# Patient Record
Sex: Female | Born: 1965 | Race: White | Hispanic: No | Marital: Married | Smoking: Current every day smoker
Health system: Southern US, Community
[De-identification: ages and names within clinical notes are randomized; demographics above are authoritative.]

## PROBLEM LIST (undated history)

## (undated) DIAGNOSIS — L400 Psoriasis vulgaris: Secondary | ICD-10-CM

## (undated) DIAGNOSIS — R7989 Other specified abnormal findings of blood chemistry: Secondary | ICD-10-CM

## (undated) DIAGNOSIS — IMO0002 Reserved for concepts with insufficient information to code with codable children: Secondary | ICD-10-CM

## (undated) DIAGNOSIS — R87619 Unspecified abnormal cytological findings in specimens from cervix uteri: Secondary | ICD-10-CM

## (undated) DIAGNOSIS — K219 Gastro-esophageal reflux disease without esophagitis: Secondary | ICD-10-CM

## (undated) DIAGNOSIS — Z1371 Encounter for nonprocreative screening for genetic disease carrier status: Secondary | ICD-10-CM

## (undated) DIAGNOSIS — N946 Dysmenorrhea, unspecified: Secondary | ICD-10-CM

## (undated) DIAGNOSIS — R7309 Other abnormal glucose: Secondary | ICD-10-CM

## (undated) DIAGNOSIS — Z803 Family history of malignant neoplasm of breast: Secondary | ICD-10-CM

## (undated) DIAGNOSIS — E785 Hyperlipidemia, unspecified: Secondary | ICD-10-CM

## (undated) DIAGNOSIS — L309 Dermatitis, unspecified: Secondary | ICD-10-CM

## (undated) DIAGNOSIS — F319 Bipolar disorder, unspecified: Secondary | ICD-10-CM

## (undated) DIAGNOSIS — D219 Benign neoplasm of connective and other soft tissue, unspecified: Secondary | ICD-10-CM

## (undated) HISTORY — DX: Dermatitis, unspecified: L30.9

## (undated) HISTORY — DX: Other specified abnormal findings of blood chemistry: R79.89

## (undated) HISTORY — PX: VAGINAL WOUND CLOSURE / REPAIR: SUR258

## (undated) HISTORY — DX: Other abnormal glucose: R73.09

## (undated) HISTORY — DX: Benign neoplasm of connective and other soft tissue, unspecified: D21.9

## (undated) HISTORY — DX: Family history of malignant neoplasm of breast: Z80.3

## (undated) HISTORY — DX: Bipolar disorder, unspecified: F31.9

## (undated) HISTORY — DX: Psoriasis vulgaris: L40.0

## (undated) HISTORY — PX: CHOLECYSTECTOMY: SHX55

## (undated) HISTORY — DX: Encounter for nonprocreative screening for genetic disease carrier status: Z13.71

## (undated) HISTORY — DX: Reserved for concepts with insufficient information to code with codable children: IMO0002

## (undated) HISTORY — DX: Dysmenorrhea, unspecified: N94.6

## (undated) HISTORY — DX: Unspecified abnormal cytological findings in specimens from cervix uteri: R87.619

## (undated) HISTORY — DX: Hyperlipidemia, unspecified: E78.5

---

## 1989-05-18 HISTORY — PX: TUBAL LIGATION: SHX77

## 1990-05-18 HISTORY — PX: BREAST SURGERY: SHX581

## 2007-02-21 ENCOUNTER — Other Ambulatory Visit: Admission: RE | Admit: 2007-02-21 | Discharge: 2007-02-21 | Payer: Self-pay | Admitting: Obstetrics and Gynecology

## 2007-02-24 ENCOUNTER — Ambulatory Visit (HOSPITAL_COMMUNITY): Admission: RE | Admit: 2007-02-24 | Discharge: 2007-02-24 | Payer: Self-pay | Admitting: Certified Nurse Midwife

## 2007-04-05 HISTORY — PX: NOVASURE ABLATION: SHX5394

## 2007-12-06 ENCOUNTER — Emergency Department: Payer: Self-pay | Admitting: Emergency Medicine

## 2007-12-08 ENCOUNTER — Emergency Department (HOSPITAL_COMMUNITY): Admission: EM | Admit: 2007-12-08 | Discharge: 2007-12-08 | Payer: Self-pay | Admitting: Emergency Medicine

## 2008-03-22 ENCOUNTER — Other Ambulatory Visit: Admission: RE | Admit: 2008-03-22 | Discharge: 2008-03-22 | Payer: Self-pay | Admitting: Obstetrics & Gynecology

## 2008-04-13 ENCOUNTER — Encounter: Admission: RE | Admit: 2008-04-13 | Discharge: 2008-04-13 | Payer: Self-pay | Admitting: Obstetrics and Gynecology

## 2008-09-15 HISTORY — PX: ROBOTIC ASSISTED TOTAL HYSTERECTOMY: SHX6085

## 2008-09-17 ENCOUNTER — Encounter: Payer: Self-pay | Admitting: Obstetrics and Gynecology

## 2008-09-17 ENCOUNTER — Ambulatory Visit (HOSPITAL_COMMUNITY): Admission: RE | Admit: 2008-09-17 | Discharge: 2008-09-18 | Payer: Self-pay | Admitting: Obstetrics and Gynecology

## 2009-03-18 HISTORY — PX: BREAST BIOPSY: SHX20

## 2009-04-02 ENCOUNTER — Encounter: Admission: RE | Admit: 2009-04-02 | Discharge: 2009-04-02 | Payer: Self-pay | Admitting: Obstetrics and Gynecology

## 2009-04-02 ENCOUNTER — Encounter (INDEPENDENT_AMBULATORY_CARE_PROVIDER_SITE_OTHER): Payer: Self-pay | Admitting: Diagnostic Radiology

## 2009-04-18 ENCOUNTER — Encounter: Admission: RE | Admit: 2009-04-18 | Discharge: 2009-04-18 | Payer: Self-pay | Admitting: General Surgery

## 2009-04-18 ENCOUNTER — Other Ambulatory Visit: Admission: RE | Admit: 2009-04-18 | Discharge: 2009-04-18 | Payer: Self-pay | Admitting: General Surgery

## 2009-05-18 DIAGNOSIS — Z1371 Encounter for nonprocreative screening for genetic disease carrier status: Secondary | ICD-10-CM

## 2009-05-18 HISTORY — DX: Encounter for nonprocreative screening for genetic disease carrier status: Z13.71

## 2009-07-16 HISTORY — PX: LAPAROSCOPIC CHOLECYSTECTOMY SINGLE PORT: SHX5891

## 2009-10-03 ENCOUNTER — Ambulatory Visit: Payer: Self-pay | Admitting: Genetic Counselor

## 2010-03-09 ENCOUNTER — Ambulatory Visit: Payer: Self-pay | Admitting: Diagnostic Radiology

## 2010-03-09 ENCOUNTER — Emergency Department (HOSPITAL_BASED_OUTPATIENT_CLINIC_OR_DEPARTMENT_OTHER): Admission: EM | Admit: 2010-03-09 | Discharge: 2010-03-09 | Payer: Self-pay | Admitting: Emergency Medicine

## 2010-04-03 ENCOUNTER — Encounter: Admission: RE | Admit: 2010-04-03 | Discharge: 2010-04-03 | Payer: Self-pay | Admitting: Obstetrics and Gynecology

## 2010-05-21 ENCOUNTER — Emergency Department (HOSPITAL_BASED_OUTPATIENT_CLINIC_OR_DEPARTMENT_OTHER)
Admission: EM | Admit: 2010-05-21 | Discharge: 2010-05-21 | Payer: Self-pay | Source: Home / Self Care | Admitting: Emergency Medicine

## 2010-05-21 LAB — URINALYSIS, ROUTINE W REFLEX MICROSCOPIC
Bilirubin Urine: NEGATIVE
Hemoglobin, Urine: NEGATIVE
Ketones, ur: NEGATIVE mg/dL
Nitrite: NEGATIVE
Protein, ur: NEGATIVE mg/dL
Specific Gravity, Urine: 1.021 (ref 1.005–1.030)
Urine Glucose, Fasting: NEGATIVE mg/dL
Urobilinogen, UA: 0.2 mg/dL (ref 0.0–1.0)
pH: 7 (ref 5.0–8.0)

## 2010-05-21 LAB — D-DIMER, QUANTITATIVE: D-Dimer, Quant: <0.22 ug/mL-FEU (ref 0.00–0.48)

## 2010-05-21 LAB — DIFFERENTIAL
Basophils Absolute: 0 10*3/uL (ref 0.0–0.1)
Basophils Relative: 0 % (ref 0–1)
Eosinophils Absolute: 0.1 10*3/uL (ref 0.0–0.7)
Eosinophils Relative: 1 % (ref 0–5)
Lymphocytes Relative: 36 % (ref 12–46)
Lymphs Abs: 3.4 10*3/uL (ref 0.7–4.0)
Monocytes Absolute: 0.8 10*3/uL (ref 0.1–1.0)
Monocytes Relative: 8 % (ref 3–12)
Neutro Abs: 5.2 10*3/uL (ref 1.7–7.7)
Neutrophils Relative %: 55 % (ref 43–77)

## 2010-05-21 LAB — BASIC METABOLIC PANEL
BUN: 12 mg/dL (ref 6–23)
CO2: 22 mEq/L (ref 19–32)
Calcium: 9.4 mg/dL (ref 8.4–10.5)
Chloride: 108 mEq/L (ref 96–112)
Creatinine, Ser: 0.7 mg/dL (ref 0.4–1.2)
GFR calc Af Amer: >60 mL/min (ref >60)
GFR calc non Af Amer: >60 mL/min (ref >60)
Glucose, Bld: 91 mg/dL (ref 70–99)
Potassium: 4.1 mEq/L (ref 3.5–5.1)
Sodium: 142 mEq/L (ref 135–145)

## 2010-05-21 LAB — CBC
HCT: 39.9 % (ref 36.0–46.0)
Hemoglobin: 14.1 g/dL (ref 12.0–15.0)
MCH: 30.7 pg (ref 26.0–34.0)
MCHC: 35.3 g/dL (ref 30.0–36.0)
MCV: 86.7 fL (ref 78.0–100.0)
Platelets: 245 10*3/uL (ref 150–400)
RBC: 4.6 MIL/uL (ref 3.87–5.11)
RDW: 13.3 % (ref 11.5–15.5)
WBC: 9.4 10*3/uL (ref 4.0–10.5)

## 2010-05-21 LAB — POCT CARDIAC MARKERS
CKMB, poc: <1 ng/mL — ABNORMAL LOW (ref 1.0–8.0)
CKMB, poc: <1 ng/mL — ABNORMAL LOW (ref 1.0–8.0)
Myoglobin, poc: 52.1 ng/mL (ref 12–200)
Myoglobin, poc: 50.2 ng/mL (ref 12–200)
Troponin i, poc: <0.05 ng/mL (ref 0.00–0.09)
Troponin i, poc: <0.05 ng/mL (ref 0.00–0.09)

## 2010-05-21 LAB — PREGNANCY, URINE: Preg Test, Ur: NEGATIVE

## 2010-08-26 LAB — CBC
HCT: 33.3 % — ABNORMAL LOW (ref 36.0–46.0)
Hemoglobin: 11.6 g/dL — ABNORMAL LOW (ref 12.0–15.0)
Platelets: 253 10*3/uL (ref 150–400)
RBC: 3.54 MIL/uL — ABNORMAL LOW (ref 3.87–5.11)
WBC: 12 10*3/uL — ABNORMAL HIGH (ref 4.0–10.5)
WBC: 17.3 10*3/uL — ABNORMAL HIGH (ref 4.0–10.5)

## 2010-09-30 NOTE — Op Note (Signed)
[name], Alicia Long                 ACCOUNT NO.:  [id number]   MEDICAL RECORD NO.:  [id number]          PATIENT TYPE:  OIB   LOCATION:  9318                          FACILITY:  [facility]   PHYSICIAN:  Cynthia P. Romine, M.D.DATE OF BIRTH:  [birth year]   DATE OF PROCEDURE:  DATE OF DISCHARGE:                               OPERATIVE REPORT   PREOPERATIVE DIAGNOSES:  Menorrhagia, dysmenorrhea.   POSTOPERATIVE DIAGNOSES:  Menorrhagia, dysmenorrhea, path pending.   PROCEDURE:  Total laparoscopic hysterectomy.   SURGEON:  Cynthia P. Romine, MD   ANESTHESIA:  General endotracheal.   ESTIMATED BLOOD LOSS:  300 mL.   COMPLICATIONS:  None.   PROCEDURE:  The patient was taken to the operating room, and after the  induction of adequate general endotracheal anesthesia, was prepped and  draped in the usual fashion and a Foley catheter inserted.  A posterior  weighted and anterior Sims retractor were placed in the vagina.  The  cervix was grasped on its anterior and posterior lips with a single-  tooth tenaculum.  The uterus sounded to 9 cm.  The cervix was dilated to  a #25 Shawnie Pons.  An 8-cm Rumi manipulator was inserted with a medium coring  and 50 mL of water was placed in the occluder.  Attention was next  turned to the abdomen.  A small infraumbilical incision was made at the  site of her previous tubal sterilization procedure after infiltrating it  with 0.25% plain Marcaine.  The Veress needle was inserted into the  peritoneal space by hooking up the CO2 insufflator to the needle and  watching the pressure drop as the peritoneal space was entered.  Pneumoperitoneum was created with 3 L of CO2 with the automatic  insufflator.  The OptiVu trocar was then inserted into the peritoneal  space.  The pelvis was inspected.  The uterus was enlarged about 10-week  size and mobile.  The ovaries and tubes appeared normal except that the  patient was status post a tubal sterilization procedure.  The mid  isthmic portions of the tubes on both sides were absent.  The anterior  and posterior cul-de-sacs were free.  A 10-mm trocar was inserted under  direct visualization after infiltration with Marcaine into the skin in  the right mid quadrant and a 10 mm one was inserted into the left mid  quadrant and the 5 mm one was inserted into the right lower quadrant.  An atraumatic grasper was placed at the 5-mm port.  The EnSeal device  was placed through the 10-mm port, and the pelvis was again carefully  inspected.  The ureters were found peristalsing on both sides and their  courses were clearly visible.  The EnSeal was used to divide the round  ligament and then the utero-ovarian ligaments and mesosalpinx on each  side.  The broad ligament was taken down sharply with laparoscopic  scissors, anterior and posterior leaf.  The bladder was dissected  sharply and bluntly off the cervix.  The uterine arteries were  skeletonized.  They were clearly visible.  The coring was also clearly  palpable.  The coring was elevated as much as possible, and the uterine  arteries were taken with the EnSeal device.  The harmonic ACE was then  used for the colpotomy incision, and the specimen was delivered through  the vagina.  The vaginal mucosa was closed vaginally with 4 interrupted  sutures of 0 Vicryl.  Attention was next turned back to the abdomen.  The pedicles were all inspected and were hemostatic.  The left ureter  could not be reidentified presumably due to swelling and gas behind the  peritoneum, but the right one was clearly identified and the pedicles  were not near enough to the course of the ureter where it had been seen  before for the ureter to have been injured in any way.  These pedicles  were quite far away from the ureter from the course where it had been  seen before.  The pelvis was irrigated.  The pneumoperitoneum was  allowed to escape about halfway, and then the pedicles were inspected   again and were hemostatic.  Trocars were then removed under direct  visualization.  The pneumoperitoneum was further allowed to escape.  The  fascia was closed at the umbilicus in the 10-mm port.  The skin was  closed subcuticularly in the 10-mm port with 4-0 Vicryl Rapide, and then  Benzoin and Steri-Strips were placed over all the incisions.  Instruments were removed from the vagina.  The Foley catheter was  removed, and the procedure was terminated.  The patient tolerated it  well and went in satisfactory condition to postanesthesia recovery.      Cynthia P. Romine, M.D.  Electronically Signed     CPR/MEDQ  D:  09/17/2008  T:  09/18/2008  Job:  [id number]

## 2010-10-03 NOTE — Discharge Summary (Signed)
NAMELAIKLYNN, RACZYNSKI                 ACCOUNT NO.:  [id number]   MEDICAL RECORD NO.:  [id number]          PATIENT TYPE:  OIB   LOCATION:  9318                          FACILITY:  [facility]   PHYSICIAN:  Cynthia P. Romine, M.D.DATE OF BIRTH:  10/30/1965   DATE OF ADMISSION:  09/17/2008  DATE OF DISCHARGE:  09/18/2008                               DISCHARGE SUMMARY   DISCHARGE DIAGNOSIS:  Menorrhagia, dysmenorrhea, and leiomyomata.   PROCEDURE:  Total laparoscopic hysterectomy.   HISTORY:  This is a 45 year old white female with complaints of  dysmenorrhea and menorrhagia, who is admitted to undergo a total  laparoscopic hysterectomy.  On Sep 17, 2008, she underwent a total  laparoscopic hysterectomy.  Estimated blood loss was 300 mL.  There were  no complications.  Postoperatively, she did very well, had no  postoperative complications and was discharged on postoperative day #1,  afebrile and in good condition.   LABS FINDINGS:  On admission, her H&H was 14 and 41.  On discharge, 11.6  and 33.3.   Pathology confirmed leiomyomata with a uterine weight of 111 g.      Cynthia P. Romine, M.D.  Electronically Signed     CPR/MEDQ  D:  10/12/2008  T:  10/13/2008  Job:  [id number]

## 2011-02-13 LAB — URINE MICROSCOPIC-ADD ON

## 2011-02-13 LAB — CBC
HCT: 42.1
Hemoglobin: 14.4
MCHC: 34.3
RBC: 4.63
RDW: 12.9

## 2011-02-13 LAB — URINE CULTURE

## 2011-02-13 LAB — LIPASE, BLOOD: Lipase: 22

## 2011-02-13 LAB — COMPREHENSIVE METABOLIC PANEL
Alkaline Phosphatase: 67
BUN: 9
CO2: 25
GFR calc non Af Amer: >60
Glucose, Bld: 94
Potassium: 3.6
Total Protein: 7.2

## 2011-02-13 LAB — DIFFERENTIAL
Basophils Absolute: 0
Basophils Relative: 0
Eosinophils Absolute: 0.1
Monocytes Relative: 6
Neutro Abs: 7.1
Neutrophils Relative %: 60

## 2011-02-13 LAB — URINALYSIS, ROUTINE W REFLEX MICROSCOPIC
Glucose, UA: NEGATIVE
Hgb urine dipstick: NEGATIVE
Specific Gravity, Urine: 1.019

## 2011-03-05 ENCOUNTER — Other Ambulatory Visit: Payer: Self-pay | Admitting: Obstetrics and Gynecology

## 2011-03-05 DIAGNOSIS — Z1231 Encounter for screening mammogram for malignant neoplasm of breast: Secondary | ICD-10-CM

## 2011-04-06 ENCOUNTER — Ambulatory Visit
Admission: RE | Admit: 2011-04-06 | Discharge: 2011-04-06 | Disposition: A | Discharge status: Home / Self Care | Payer: Managed Care, Other (non HMO) | Source: Ambulatory Visit | Attending: Obstetrics and Gynecology | Admitting: Obstetrics and Gynecology

## 2011-04-06 DIAGNOSIS — Z1231 Encounter for screening mammogram for malignant neoplasm of breast: Secondary | ICD-10-CM

## 2012-03-02 ENCOUNTER — Other Ambulatory Visit: Payer: Self-pay | Admitting: Obstetrics and Gynecology

## 2012-03-02 DIAGNOSIS — Z1231 Encounter for screening mammogram for malignant neoplasm of breast: Secondary | ICD-10-CM

## 2012-04-06 ENCOUNTER — Ambulatory Visit
Admission: RE | Admit: 2012-04-06 | Discharge: 2012-04-06 | Disposition: A | Discharge status: Home / Self Care | Payer: Managed Care, Other (non HMO) | Source: Ambulatory Visit | Attending: Obstetrics and Gynecology | Admitting: Obstetrics and Gynecology

## 2012-04-06 DIAGNOSIS — Z1231 Encounter for screening mammogram for malignant neoplasm of breast: Secondary | ICD-10-CM

## 2012-06-18 HISTORY — PX: NASAL SINUS SURGERY: SHX719

## 2012-06-18 HISTORY — PX: TYMPANOSTOMY TUBE PLACEMENT: SHX32

## 2012-07-01 ENCOUNTER — Ambulatory Visit: Payer: Self-pay | Admitting: Unknown Physician Specialty

## 2012-08-10 ENCOUNTER — Ambulatory Visit: Payer: Self-pay | Admitting: Unknown Physician Specialty

## 2012-11-14 ENCOUNTER — Emergency Department: Payer: Self-pay | Admitting: Internal Medicine

## 2012-11-24 ENCOUNTER — Encounter: Payer: Self-pay | Admitting: Obstetrics and Gynecology

## 2012-11-25 ENCOUNTER — Ambulatory Visit (INDEPENDENT_AMBULATORY_CARE_PROVIDER_SITE_OTHER): Payer: Managed Care, Other (non HMO) | Admitting: Obstetrics and Gynecology

## 2012-11-25 ENCOUNTER — Encounter: Payer: Self-pay | Admitting: Obstetrics and Gynecology

## 2012-11-25 VITALS — BP 100/60 | Ht 66.0 in | Wt 194.0 lb

## 2012-11-25 DIAGNOSIS — Z01419 Encounter for gynecological examination (general) (routine) without abnormal findings: Secondary | ICD-10-CM

## 2012-11-25 DIAGNOSIS — Z0000 Encounter for general adult medical examination without abnormal findings: Secondary | ICD-10-CM

## 2012-11-25 LAB — HEMOGLOBIN, FINGERSTICK: Hemoglobin, fingerstick: 13.7 g/dL (ref 12.0–16.0)

## 2012-11-25 NOTE — Patient Instructions (Addendum)
EXERCISE AND DIET:  We recommended that you start or continue a regular exercise program for good health. Regular exercise means any activity that makes your heart beat faster and makes you sweat.  We recommend exercising at least 30 minutes per day at least 3 days a week, preferably 4 or 5.  We also recommend a diet low in fat and sugar.  Inactivity, poor dietary choices and obesity can cause diabetes, heart attack, stroke, and kidney damage, among others.    ALCOHOL AND SMOKING:  Women should limit their alcohol intake to no more than 7 drinks/beers/glasses of wine (combined, not each!) per week. Moderation of alcohol intake to this level decreases your risk of breast cancer and liver damage. And of course, no recreational drugs are part of a healthy lifestyle.  And absolutely no smoking or even second hand smoke. Most people know smoking can cause heart and lung diseases, but did you know it also contributes to weakening of your bones? Aging of your skin?  Yellowing of your teeth and nails?  CALCIUM AND VITAMIN D:  Adequate intake of calcium and Vitamin D are recommended.  The recommendations for exact amounts of these supplements seem to change often, but generally speaking 600 mg of calcium (either carbonate or citrate) and 800 units of Vitamin D per day seems prudent. Certain women may benefit from higher intake of Vitamin D.  If you are among these women, your doctor will have told you during your visit.    PAP SMEARS:  Pap smears, to check for cervical cancer or precancers,  have traditionally been done yearly, although recent scientific advances have shown that most women can have pap smears less often.  However, every woman still should have a physical exam from her gynecologist every year. It will include a breast check, inspection of the vulva and vagina to check for abnormal growths or skin changes, a visual exam of the cervix, and then an exam to evaluate the size and shape of the uterus and  ovaries.  And after 45 years of age, a rectal exam is indicated to check for rectal cancers. We will also provide age appropriate advice regarding health maintenance, like when you should have certain vaccines, screening for sexually transmitted diseases, bone density testing, colonoscopy, mammograms, etc.   MAMMOGRAMS:  All women over 40 years old should have a yearly mammogram. Many facilities now offer a "3D" mammogram, which may cost around $50 extra out of pocket. If possible,  we recommend you accept the option to have the 3D mammogram performed.  It both reduces the number of women who will be called back for extra views which then turn out to be normal, and it is better than the routine mammogram at detecting truly abnormal areas.       
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
## 2012-11-26 LAB — VITAMIN D 25 HYDROXY (VIT D DEFICIENCY, FRACTURES): Vit D, 25-Hydroxy: 44 ng/mL (ref 30–89)

## 2013-03-07 ENCOUNTER — Other Ambulatory Visit: Payer: Self-pay

## 2013-03-07 DIAGNOSIS — Z1231 Encounter for screening mammogram for malignant neoplasm of breast: Secondary | ICD-10-CM

## 2013-03-17 ENCOUNTER — Other Ambulatory Visit: Payer: Self-pay | Admitting: Emergency Medicine

## 2013-03-17 ENCOUNTER — Ambulatory Visit
Admission: RE | Admit: 2013-03-17 | Discharge: 2013-03-17 | Disposition: A | Discharge status: Home / Self Care | Payer: Managed Care, Other (non HMO) | Source: Ambulatory Visit | Attending: Emergency Medicine | Admitting: Emergency Medicine

## 2013-03-17 DIAGNOSIS — M545 Low back pain, unspecified: Secondary | ICD-10-CM

## 2013-03-17 DIAGNOSIS — M542 Cervicalgia: Secondary | ICD-10-CM

## 2013-03-23 ENCOUNTER — Other Ambulatory Visit: Payer: Self-pay

## 2013-04-10 ENCOUNTER — Ambulatory Visit: Payer: Managed Care, Other (non HMO)

## 2013-05-22 ENCOUNTER — Ambulatory Visit
Admission: RE | Admit: 2013-05-22 | Discharge: 2013-05-22 | Disposition: A | Discharge status: Home / Self Care | Payer: Managed Care, Other (non HMO) | Source: Ambulatory Visit

## 2013-05-22 DIAGNOSIS — Z1231 Encounter for screening mammogram for malignant neoplasm of breast: Secondary | ICD-10-CM

## 2013-11-28 ENCOUNTER — Ambulatory Visit: Payer: Managed Care, Other (non HMO) | Admitting: Gynecology

## 2013-12-13 ENCOUNTER — Encounter: Payer: Self-pay | Admitting: Gynecology

## 2013-12-13 ENCOUNTER — Telehealth: Payer: Self-pay | Admitting: Gynecology

## 2013-12-13 ENCOUNTER — Ambulatory Visit (INDEPENDENT_AMBULATORY_CARE_PROVIDER_SITE_OTHER): Payer: Managed Care, Other (non HMO) | Admitting: Gynecology

## 2013-12-13 VITALS — BP 114/66 | HR 74 | Resp 18 | Ht 67.75 in | Wt 176.0 lb

## 2013-12-13 DIAGNOSIS — Z79899 Other long term (current) drug therapy: Secondary | ICD-10-CM

## 2013-12-13 DIAGNOSIS — IMO0002 Reserved for concepts with insufficient information to code with codable children: Secondary | ICD-10-CM

## 2013-12-13 DIAGNOSIS — F3175 Bipolar disorder, in partial remission, most recent episode depressed: Secondary | ICD-10-CM

## 2013-12-13 DIAGNOSIS — Z01419 Encounter for gynecological examination (general) (routine) without abnormal findings: Secondary | ICD-10-CM

## 2013-12-13 DIAGNOSIS — N644 Mastodynia: Secondary | ICD-10-CM

## 2013-12-13 DIAGNOSIS — R634 Abnormal weight loss: Secondary | ICD-10-CM

## 2013-12-13 DIAGNOSIS — K649 Unspecified hemorrhoids: Secondary | ICD-10-CM

## 2013-12-13 DIAGNOSIS — Z1371 Encounter for nonprocreative screening for genetic disease carrier status: Secondary | ICD-10-CM

## 2013-12-13 DIAGNOSIS — Z0000 Encounter for general adult medical examination without abnormal findings: Secondary | ICD-10-CM

## 2013-12-13 DIAGNOSIS — Z8049 Family history of malignant neoplasm of other genital organs: Secondary | ICD-10-CM

## 2013-12-13 LAB — CBC WITH DIFFERENTIAL/PLATELET
Basophils Absolute: 0 10*3/uL (ref 0.0–0.1)
Basophils Relative: 0 % (ref 0–1)
Eosinophils Absolute: 0.2 10*3/uL (ref 0.0–0.7)
Eosinophils Relative: 2 % (ref 0–5)
HCT: 40.7 % (ref 36.0–46.0)
HEMOGLOBIN: 13.9 g/dL (ref 12.0–15.0)
LYMPHS PCT: 46 % (ref 12–46)
Lymphs Abs: 4.3 10*3/uL — ABNORMAL HIGH (ref 0.7–4.0)
MCH: 31.8 pg (ref 26.0–34.0)
MCHC: 34.2 g/dL (ref 30.0–36.0)
MCV: 93.1 fL (ref 78.0–100.0)
Monocytes Absolute: 0.5 10*3/uL (ref 0.1–1.0)
Monocytes Relative: 5 % (ref 3–12)
NEUTROS ABS: 4.4 10*3/uL (ref 1.7–7.7)
Neutrophils Relative %: 47 % (ref 43–77)
Platelets: 222 10*3/uL (ref 150–400)
RBC: 4.37 MIL/uL (ref 3.87–5.11)
RDW: 13.3 % (ref 11.5–15.5)
WBC: 9.3 10*3/uL (ref 4.0–10.5)

## 2013-12-13 LAB — HEPATIC FUNCTION PANEL
ALBUMIN: 4.5 g/dL (ref 3.5–5.2)
ALK PHOS: 55 U/L (ref 39–117)
ALT: 8 U/L (ref 0–35)
AST: 13 U/L (ref 0–37)
BILIRUBIN TOTAL: 0.3 mg/dL (ref 0.2–1.2)
Bilirubin, Direct: 0.1 mg/dL (ref 0.0–0.3)
Indirect Bilirubin: 0.2 mg/dL (ref 0.2–1.2)
Total Protein: 6.8 g/dL (ref 6.0–8.3)

## 2013-12-13 LAB — POCT URINALYSIS DIPSTICK
Leukocytes, UA: NEGATIVE
PH UA: 6
Urobilinogen, UA: NEGATIVE

## 2013-12-13 LAB — HEMOGLOBIN, FINGERSTICK: Hemoglobin, fingerstick: 14.3 g/dL (ref 12.0–16.0)

## 2013-12-13 LAB — HEMOGLOBIN A1C
Hgb A1c MFr Bld: 5.6 % (ref <5.7)
Mean Plasma Glucose: 114 mg/dL (ref <117)

## 2013-12-13 LAB — TSH: TSH: 1.838 u[IU]/mL (ref 0.350–4.500)

## 2013-12-13 LAB — GLUCOSE, RANDOM: GLUCOSE: 86 mg/dL (ref 70–99)

## 2013-12-13 MED ORDER — HYDROCORTISONE ACETATE 25 MG RE SUPP: 25.0000 mg | Freq: Two times a day (BID) | RECTAL | Status: DC

## 2013-12-13 NOTE — Telephone Encounter (Signed)
Pt will be leaving to go out of town on next Friday and would like to have u/s done before if possible. Also was confused as to what Dr Charlies Constable was saying about a general surgeon when she was here today.

## 2013-12-13 NOTE — Telephone Encounter (Signed)
RETURNING A CALL TO KAITLYN
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
## 2013-12-13 NOTE — Telephone Encounter (Signed)
Left message to call [place] at [phone].    Offer patient u/s appointment for next Tuesday with Dr.Lathrop. Will need to be precerted still as patient was just seen today. Dr.Lathrop made referral to General Surgery for patient to be seen for further options for mastalgia.

## 2013-12-13 NOTE — Addendum Note (Signed)
Addended by: Elveria Rising on: 12/13/2013 01:34 PM   Modules accepted: Orders

## 2013-12-14 LAB — VALPROIC ACID LEVEL: VALPROIC ACID LVL: 63.7 ug/mL (ref 50.0–100.0)

## 2013-12-14 NOTE — Telephone Encounter (Signed)
Spoke with patient. Advised that per benefit quote received, she will be responsible for $308.71 when she comes in for PUS. Patient states that she will speak with her husband and call back to let us know if she wants to keep the appointment.

## 2013-12-14 NOTE — Telephone Encounter (Signed)
Spoke with patient. Appointment scheduled for ultrasound on 8/4 at 1:30pm and 2pm consult with Dr.Lathrop. Patient agreeable to date and time. Patient aware that billing has not had a chance to precert u/s. Advised this will be done before appointment and that she will be contacted by billing if she will owe anything more than a standard office copay for visit. Patient is agreeable and verbalizes understanding.  Cc: Felipa Emory  Routing to provider for final review. Patient agreeable to disposition. Will close encounter

## 2013-12-19 ENCOUNTER — Ambulatory Visit (INDEPENDENT_AMBULATORY_CARE_PROVIDER_SITE_OTHER): Payer: Managed Care, Other (non HMO) | Admitting: Gynecology

## 2013-12-19 ENCOUNTER — Ambulatory Visit (INDEPENDENT_AMBULATORY_CARE_PROVIDER_SITE_OTHER): Payer: Managed Care, Other (non HMO)

## 2013-12-19 VITALS — BP 110/82 | Resp 12 | Ht 67.75 in | Wt 177.0 lb

## 2013-12-19 DIAGNOSIS — Z808 Family history of malignant neoplasm of other organs or systems: Secondary | ICD-10-CM

## 2013-12-19 DIAGNOSIS — R634 Abnormal weight loss: Secondary | ICD-10-CM

## 2013-12-19 DIAGNOSIS — Z8049 Family history of malignant neoplasm of other genital organs: Secondary | ICD-10-CM

## 2013-12-19 DIAGNOSIS — Z803 Family history of malignant neoplasm of breast: Secondary | ICD-10-CM

## 2013-12-19 NOTE — Progress Notes (Signed)
      Pt here for PUS due to unexplained weight loss and family history of female breast cancer.   TSH, glucose, Hb A1c, normal Images reviewed with pt- Bilateral ovarian cysts with debris, right 2.5x2.1cm No free fluid, no abnormal appearance. Pre-menopausal. Pt relieved,  Will keep up with diet and weight changes, if persists will f/u with PCP. Questions addressed. 72m sepnt counseling, >50% face to face

## 2013-12-21 NOTE — Telephone Encounter (Signed)
Thayer Headings is calling to let us know they schedule the patient for 02/01/14.

## 2013-12-29 ENCOUNTER — Ambulatory Visit (INDEPENDENT_AMBULATORY_CARE_PROVIDER_SITE_OTHER): Payer: Self-pay | Admitting: General Surgery

## 2014-01-18 ENCOUNTER — Telehealth: Payer: Self-pay | Admitting: Gynecology

## 2014-01-18 ENCOUNTER — Encounter: Payer: Self-pay | Admitting: Obstetrics and Gynecology

## 2014-01-18 ENCOUNTER — Ambulatory Visit (INDEPENDENT_AMBULATORY_CARE_PROVIDER_SITE_OTHER): Payer: Managed Care, Other (non HMO) | Admitting: Obstetrics and Gynecology

## 2014-01-18 VITALS — BP 118/70 | HR 70 | Resp 16 | Ht 67.75 in | Wt 179.2 lb

## 2014-01-18 DIAGNOSIS — G8929 Other chronic pain: Secondary | ICD-10-CM

## 2014-01-18 DIAGNOSIS — N644 Mastodynia: Secondary | ICD-10-CM

## 2014-01-18 MED ORDER — DICLOFENAC 18 MG PO CAPS: 18.0000 mg | ORAL_CAPSULE | Freq: Three times a day (TID) | ORAL | Status: DC

## 2014-01-18 NOTE — Telephone Encounter (Signed)
Spoke with patient. Patient states that she has ongoing bilateral breast pain that is increasing. "I wake up in the middle of the night crying and it brings me to tears during the day. It is getting worse." Patient states that her breasts swell "quite often " and she has lumps every where. "I can't deal with the pain." Last mammogram was in January. Patient was seen in office in July and referred to general surgery. Patient was scheduled for August but due to family situation had to reschedule to earliest available 9/17. Advised patient would discuss with covering MD as Dr.Lathrop is out of the office today and call back with further instructions and recommendations. Patient agreeable.

## 2014-01-18 NOTE — Patient Instructions (Addendum)
Tamoxifen oral solution What is this medicine? TAMOXIFEN (ta MOX i fen) blocks the effects of estrogen. It is commonly used to treat breast cancer. It is also used to decrease the chance of breast cancer coming back in women who have received treatment for the disease. It may also help prevent breast cancer in women who have a high risk of developing breast cancer. This medicine may be used for other purposes; ask your health care provider or pharmacist if you have questions. COMMON BRAND NAME(S): Soltamox What should I tell my health care provider before I take this medicine? They need to know if you have any of these conditions: -blood clots -blood disease -cataracts or impaired eyesight -endometriosis -high calcium levels -high cholesterol -irregular menstrual cycles -liver disease -stroke -uterine fibroids -an unusual or allergic reaction to tamoxifen, other medicines, foods, dyes, or preservatives -pregnant or trying to get pregnant -breast-feeding How should I use this medicine? Take this medicine by mouth with a glass of water. Follow the directions on the prescription label. You can take it with or without food. Take your medicine at regular intervals. Do not take your medicine more often than directed. Do not stop taking except on your doctor's advice. A special MedGuide will be given to you by the pharmacist with each prescription and refill. Be sure to read this information carefully each time. Talk to your pediatrician regarding the use of this medicine in children. While this drug may be prescribed for selected conditions, precautions do apply. Overdosage: If you think you have taken too much of this medicine contact a poison control center or emergency room at once. NOTE: This medicine is only for you. Do not share this medicine with others. What if I miss a dose? If you miss a dose, take it as soon as you can. If it is almost time for your next dose, take only that dose. Do  not take double or extra doses. What may interact with this medicine? -aminoglutethimide -bromocriptine -chemotherapy drugs -female hormones, like estrogens and birth control pills -letrozole -medroxyprogesterone -phenobarbital -rifampin -warfarin This list may not describe all possible interactions. Give your health care provider a list of all the medicines, herbs, non-prescription drugs, or dietary supplements you use. Also tell them if you smoke, drink alcohol, or use illegal drugs. Some items may interact with your medicine. What should I watch for while using this medicine? Visit your doctor or health care professional for regular checks on your progress. You will need regular pelvic exams, breast exams, and mammograms. If you are taking this medicine to reduce your risk of getting breast cancer, you should know that this medicine does not prevent all types of breast cancer. If breast cancer or other problems occur, there is no guarantee that it will be found at an early stage. Do not become pregnant while taking this medicine or for 2 months after stopping this medicine. Stop taking this medicine if you get pregnant or think you are pregnant and contact your doctor. This medicine may harm your unborn baby. Women who can possibly become pregnant should use birth control methods that do not use hormones during tamoxifen treatment and for 2 months after therapy has stopped. Talk with your health care provider for birth control advice. Do not breast feed while taking this medicine. What side effects may I notice from receiving this medicine? Side effects that you should report to your doctor or health care professional as soon as possible: -allergic reactions like skin rash, itching  or hives, swelling of the face, lips, or tongue -breathing problems -changes in vision -changes in your menstrual cycle -difficulty walking or talking -new breast lumps -numbness -pelvic pain or  pressure -redness, blistering, peeling or loosening of the skin, including inside the mouth -sudden chest pain -swelling, pain or tenderness in your calf or leg -unusual bruising or bleeding -vaginal discharge that is bloody, brown, or rust -weakness -yellowing of the whites of the eyes or skin Side effects that usually do not require medical attention (report to your doctor or health care professional if they continue or are bothersome): -fatigue -hair loss, although uncommon and is usually mild -headache -hot flashes -impotence (in men) -nausea, vomiting (mild) -vaginal discharge (white or clear) This list may not describe all possible side effects. Call your doctor for medical advice about side effects. You may report side effects to FDA at 1-800-FDA-1088. Where should I keep my medicine? Keep out of the reach of children. Store in the original package at room temperature between 20 and 25 degrees C (68 and 77 degrees F). Do not store above 25 degrees C (77 degrees F). DO NOT freeze or refrigerate. Protect from light. Keep container tightly closed. Use within 3 months of opening. Throw away any unused medicine after the expiration date. NOTE: This sheet is a summary. It may not cover all possible information. If you have questions about this medicine, talk to your doctor, pharmacist, or health care provider.  2015, Elsevier/Gold Standard. (2008-01-30 15:48:08)  Diclofenac sodium enteric-coated tablets What is this medicine? DICLOFENAC (dye KLOE fen ak) is a non-steroidal anti-inflammatory drug (NSAID). It is used to reduce swelling and to treat pain. It may be used to treat osteoarthritis, rheumatoid arthritis, and ankylosing spondylitis. This medicine may be used for other purposes; ask your health care provider or pharmacist if you have questions. COMMON BRAND NAME(S): Voltaren What should I tell my health care provider before I take this medicine? They need to know if you have any  of these conditions: -asthma, especially aspirin sensitive asthma -coronary artery bypass graft (CABG) surgery within the past 2 weeks -drink more than 3 alcohol containing drinks a day -heart disease or circulation problems like heart failure or leg edema (fluid retention) -high blood pressure -kidney disease -liver disease -stomach bleeding or ulcers -an unusual or allergic reaction to diclofenac, aspirin, other NSAIDs, other medicines, foods, dyes, or preservatives -pregnant or trying to get pregnant -breast-feeding How should I use this medicine? Take this medicine by mouth with food and with a full glass of water. Do not crush or chew the medicine. Follow the directions on the prescription label. Take your medicine at regular intervals. Do not take your medicine more often than directed. Long-term, continuous use may increase the risk of heart attack or stroke. A special MedGuide will be given to you by the pharmacist with each prescription and refill. Be sure to read this information carefully each time. Talk to your pediatrician regarding the use of this medicine in children. Special care may be needed. Elderly patients over 42 years old may have a stronger reaction and need a smaller dose. Overdosage: If you think you have taken too much of this medicine contact a poison control center or emergency room at once. NOTE: This medicine is only for you. Do not share this medicine with others. What if I miss a dose? If you miss a dose, take it as soon as you can. If it is almost time for your next dose, take  only that dose. Do not take double or extra doses. What may interact with this medicine? Do not take this medicine with any of the following medications: -cidofovir -ketorolac -methotrexate This medicine may also interact with the following medications: -alcohol -aspirin and aspirin like medicines -cyclosporine -diuretics -lithium -medicines for blood pressure -medicines for  osteoporosis -medicines that affect platelets -medicines that treat or prevent blood clots like warfarin -NSAIDs, medicines for pain and inflammation, like ibuprofen or naproxen -pemetrexed -steroid medicines like prednisone or cortisone This list may not describe all possible interactions. Give your health care provider a list of all the medicines, herbs, non-prescription drugs, or dietary supplements you use. Also tell them if you smoke, drink alcohol, or use illegal drugs. Some items may interact with your medicine. What should I watch for while using this medicine? Tell your doctor or health care professional if your pain does not get better. Talk to your doctor before taking another medicine for pain. Do not treat yourself. This medicine does not prevent heart attack or stroke. In fact, this medicine may increase the chance of a heart attack or stroke. The chance may increase with longer use of this medicine and in people who have heart disease. If you take aspirin to prevent heart attack or stroke, talk with your doctor or health care professional. Do not take medicines such as ibuprofen and naproxen with this medicine. Side effects such as stomach upset, nausea, or ulcers may be more likely to occur. Many medicines available without a prescription should not be taken with this medicine. This medicine can cause ulcers and bleeding in the stomach and intestines at any time during treatment. Do not smoke cigarettes or drink alcohol. These increase irritation to your stomach and can make it more susceptible to damage from this medicine. Ulcers and bleeding can happen without warning symptoms and can cause death. You may get drowsy or dizzy. Do not drive, use machinery, or do anything that needs mental alertness until you know how this medicine affects you. Do not stand or sit up quickly, especially if you are an older patient. This reduces the risk of dizzy or fainting spells. This medicine can cause  you to bleed more easily. Try to avoid damage to your teeth and gums when you brush or floss your teeth. What side effects may I notice from receiving this medicine? Side effects that you should report to your doctor or health care professional as soon as possible: -allergic reactions like skin rash, itching or hives, swelling of the face, lips, or tongue -black or bloody stools, blood in the urine or vomit -blurred vision -chest pain -difficulty breathing or wheezing -nausea or vomiting -slurred speech or weakness on one side of the body -unexplained weight gain or swelling -unusually weak or tired -yellowing of eyes or skin Side effects that usually do not require medical attention (report to your doctor or health care professional if they continue or are bothersome): -constipation -diarrhea -dizziness -headache -heartburn This list may not describe all possible side effects. Call your doctor for medical advice about side effects. You may report side effects to FDA at 1-800-FDA-1088. Where should I keep my medicine? Keep out of the reach of children. Store at room temperature below 30 degrees C (86 degrees F). Protect from moisture. Keep container tightly closed. Throw away any unused medicine after the expiration date. NOTE: This sheet is a summary. It may not cover all possible information. If you have questions about this medicine, talk to your  doctor, pharmacist, or health care provider.  2015, Elsevier/Gold Standard. (2008-09-21 14:42:31)
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
## 2014-01-18 NOTE — Telephone Encounter (Signed)
Patient is saying she is having extreme pain in her breast says that dr lathrop is aware of the problem but she says she cant take it anymore and wants to know if lathrop can give her some strong pain meds.

## 2014-01-18 NOTE — Telephone Encounter (Signed)
Spoke with patient. Advised needs to be seen in office today for evaluation. Patient is agreeable. Appointment scheduled for today at 11:30am with Dr.Silva (time per Gay Filler). Agreeable to date and time.  Routing to Dr.Silva as covering Cc: Dr.Lathrop  Routing to provider for final review. Patient agreeable to disposition. Will close encounter

## 2014-01-24 ENCOUNTER — Encounter: Payer: Self-pay | Admitting: Gynecology

## 2014-02-01 ENCOUNTER — Other Ambulatory Visit (INDEPENDENT_AMBULATORY_CARE_PROVIDER_SITE_OTHER): Payer: Self-pay

## 2014-02-01 ENCOUNTER — Ambulatory Visit (INDEPENDENT_AMBULATORY_CARE_PROVIDER_SITE_OTHER): Payer: Managed Care, Other (non HMO) | Admitting: General Surgery

## 2014-02-01 DIAGNOSIS — Z803 Family history of malignant neoplasm of breast: Secondary | ICD-10-CM

## 2014-02-01 DIAGNOSIS — G8929 Other chronic pain: Secondary | ICD-10-CM

## 2014-02-01 DIAGNOSIS — N644 Mastodynia: Principal | ICD-10-CM

## 2014-02-01 DIAGNOSIS — R922 Inconclusive mammogram: Secondary | ICD-10-CM

## 2014-02-02 ENCOUNTER — Telehealth: Payer: Self-pay | Admitting: *Deleted

## 2014-02-02 ENCOUNTER — Telehealth: Payer: Self-pay | Admitting: Gynecology

## 2014-02-02 NOTE — Telephone Encounter (Signed)
Pt returned my call and I confirmed 02/08/14 genetic appt w/ her.  Emailed Engineer, civil (consulting) at [employer] to make her aware.

## 2014-02-02 NOTE — Telephone Encounter (Signed)
Received referral from [facility] for genetics.  Called and left a message for the pt to return my call so I can schedule her for genetics.

## 2014-02-02 NOTE — Telephone Encounter (Signed)
Spoke with patient. Appointment scheduled for 9/29 at 1pm with Dr.Lathrop. Patient agreeable to date and time.  Routing to provider for final review. Patient agreeable to disposition. Will close encounter

## 2014-02-02 NOTE — Telephone Encounter (Signed)
Pt wants to schedule a consult with Dr. Charlies Constable to discuss her ultrasound results. She can come any day at any time so please schedule her and leave a message. She has to go out and may not be able to answer the phone.

## 2014-02-08 ENCOUNTER — Other Ambulatory Visit: Payer: Managed Care, Other (non HMO)

## 2014-02-08 ENCOUNTER — Encounter: Payer: Self-pay | Admitting: Genetic Counselor

## 2014-02-08 ENCOUNTER — Ambulatory Visit (HOSPITAL_BASED_OUTPATIENT_CLINIC_OR_DEPARTMENT_OTHER): Payer: Managed Care, Other (non HMO) | Admitting: Genetic Counselor

## 2014-02-08 DIAGNOSIS — IMO0002 Reserved for concepts with insufficient information to code with codable children: Secondary | ICD-10-CM

## 2014-02-08 DIAGNOSIS — Z803 Family history of malignant neoplasm of breast: Secondary | ICD-10-CM
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
## 2014-02-12 ENCOUNTER — Ambulatory Visit
Admission: RE | Admit: 2014-02-12 | Discharge: 2014-02-12 | Disposition: A | Discharge status: Home / Self Care | Payer: Managed Care, Other (non HMO) | Source: Ambulatory Visit | Attending: General Surgery | Admitting: General Surgery

## 2014-02-12 DIAGNOSIS — N644 Mastodynia: Secondary | ICD-10-CM

## 2014-02-12 DIAGNOSIS — R922 Inconclusive mammogram: Secondary | ICD-10-CM

## 2014-02-12 DIAGNOSIS — G8929 Other chronic pain: Secondary | ICD-10-CM

## 2014-02-12 DIAGNOSIS — Z803 Family history of malignant neoplasm of breast: Secondary | ICD-10-CM

## 2014-02-12 MED ORDER — GADOBENATE DIMEGLUMINE 529 MG/ML IV SOLN
17.0000 mL | Freq: Once | INTRAVENOUS | Status: AC | PRN
  Administered 2014-02-12: 17 mL via INTRAVENOUS

## 2014-02-13 ENCOUNTER — Ambulatory Visit (INDEPENDENT_AMBULATORY_CARE_PROVIDER_SITE_OTHER): Payer: Managed Care, Other (non HMO) | Admitting: Gynecology

## 2014-02-13 VITALS — BP 98/58 | HR 60 | Resp 16 | Wt 175.8 lb

## 2014-02-13 DIAGNOSIS — G8929 Other chronic pain: Secondary | ICD-10-CM

## 2014-02-13 DIAGNOSIS — Z808 Family history of malignant neoplasm of other organs or systems: Secondary | ICD-10-CM

## 2014-02-13 DIAGNOSIS — N644 Mastodynia: Secondary | ICD-10-CM

## 2014-02-13 DIAGNOSIS — Z803 Family history of malignant neoplasm of breast: Secondary | ICD-10-CM

## 2014-02-13 MED ORDER — AMITRIPTYLINE HCL 25 MG PO TABS: 25.0000 mg | ORAL_TABLET | Freq: Every day | ORAL | Status: DC

## 2014-02-13 MED ORDER — GABAPENTIN 300 MG PO CAPS: 300.0000 mg | ORAL_CAPSULE | Freq: Three times a day (TID) | ORAL | Status: DC

## 2014-02-14 NOTE — Progress Notes (Signed)
Pt is here for consultation.   Per our request, pt had visit with genetic counselor who agreed that based on paternal female breast cancer, she should have additional testing, the labs are still pending. In addition, pt was seen by Dr Excell Seltzer for her bilateral mastalgia. Pt had an MRI yesterday, I informed her that it was normal. Pt is tearful, she reports that her breast pain is so severe that she wears a sports bra all of the time, she cannot hug anyone, she cannot sleep except wedged upright in a chair, if she rolls onto her chest she walks up.  Pt reports that the pain has been for over 2y and is getting worse.  Pt states that it affects her daily being.  Pt has tried every therapy presented to her including  vitE, eliminating caffeine, + others.  She has not tried danazol. Pt is interested in B/L mastectomy with reconstruction for mostly the daily discomfort as well as the prophylaxis due to family history. In addition, pt would like to have a prophylactic BSO.  She is s/p robotic hysterectomy in 2010, review of pathology confirms both tubes and ovaries are in place.  We reviewed her risk, that the genetic mutation for female breast cancer is most often BRCA2 which caries a lower risk for ovarian cancer and the cancer often presents later in life compared to [place].  Pt may want to keep her ovaries until 52 as she continues to ovulate and she would then get the benefits of the estrogen. Risks of premature surgical menopause of earlier onset of osteoporosis, CVD, AS were reviewed.  Pt does not object to post-op estrogen therapy to decrease those risks.  In addition, the theory that ovarian cancer is tubal in nature was also removed and removal of tubes alone might suffice was also discussed. Pt is tearful during visit. Her main issue is the breast pain, I suggest she get a second opinion as Dr Excell Seltzer suggested she watch her sx for another 1-2y-we will make that referral for her. We discussed non-narcotic  options to treat her pain-neurontin, elavil, lyrica and cymbalta were reviewed.  She is interested in trying these, rx for neurontin and elavil were sent in and pt was instructed on use and side effects. If she has b/l mastectomy, would be interested in BSO at the same time. 3mspent counseling, >50% face to face

## 2014-02-15 ENCOUNTER — Encounter: Payer: Self-pay | Admitting: Gynecology

## 2014-02-16 NOTE — Telephone Encounter (Signed)
Per OV note from 02-13-14, she was considering mastectomy and BSO.  Do you want ACOG pamphlet on BSO mailed to her?

## 2014-02-20 ENCOUNTER — Encounter: Payer: Self-pay | Admitting: Gynecology

## 2014-02-26 ENCOUNTER — Encounter: Payer: Self-pay | Admitting: Genetic Counselor

## 2014-02-26 NOTE — Progress Notes (Signed)
GENETIC TEST RESULTS  Patient Name: Alicia Long Patient Age: 48 y.o. Encounter Date: 02/26/2014  Referring Physician: Elveria Rising, MD   Alicia Long was called today to discuss genetic test results. Please see the Genetics note from her visit on 02/08/14 for a detailed discussion of her personal and family history.  GENETIC TESTING: At the time of Alicia Long's visit, we recommended she pursue genetic testing of multiple genes on the BreastNext gene panel. This test, which included sequencing and deletion/duplication analysis of 17 genes, was performed at [facility]. Testing was normal and did not reveal a mutation in these genes. The genes tested were ATM, BARD1, BRCA1, BRCA2, BRIP1, CDH1, CHEK2, MRE11A, MUTYH, NBN, NF1, PALB2, PTEN, RAD50, RAD51C, RAD51D, and TP53.  We discussed with Alicia Long that since the current test is not perfect, it is possible there may be a gene mutation that current testing cannot detect, but that chance is small. We also discussed that it is possible that a different genetic factor, which was not part of this testing or has not yet been discovered, is responsible for the cancer diagnoses in the family. It may also be that her distant maternal relatives have a detectable mutation that Alicia Long did not inherit. For this reason, her maternal aunt who had breast cancer in her 45s is recommended to undergo genetic testing.  CANCER SCREENING:  This normal result is reassuring and indicates that Alicia Long does not likely have an increased risk of cancer due to a mutation in one of these genes. We recommended Alicia Long continue to follow the cancer screening guidelines provided by her primary physician.   Lastly, we discussed with Alicia Long that cancer genetics is a rapidly advancing field and it is possible that new genetic tests will be appropriate for her in the future. We encouraged her to remain in contact with [place] on an annual basis so we can update her  personal and family histories, and let her know of advances in cancer genetics that may benefit the family. Our contact number was provided. Alicia Long questions were answered to her satisfaction today, and she knows she is welcome to call anytime with additional questions.    Steele Berg, MS, [place] Certified Genetic Counseor phone: [phone] [email or web address]

## 2014-03-02 ENCOUNTER — Encounter: Payer: Self-pay | Admitting: Gynecology

## 2014-03-02 ENCOUNTER — Other Ambulatory Visit: Payer: Self-pay | Admitting: Gynecology

## 2014-03-02 DIAGNOSIS — G8929 Other chronic pain: Secondary | ICD-10-CM

## 2014-03-02 DIAGNOSIS — N644 Mastodynia: Principal | ICD-10-CM

## 2014-03-02 MED ORDER — GABAPENTIN 100 MG PO CAPS: 100.0000 mg | ORAL_CAPSULE | Freq: Three times a day (TID) | ORAL | Status: DC

## 2014-03-07 ENCOUNTER — Encounter: Payer: Self-pay | Admitting: Gynecology

## 2014-03-09 NOTE — Telephone Encounter (Signed)
OV 02-13-14, discussion of possible BSO if had mastectomy. Please advise.

## 2014-03-12 ENCOUNTER — Telehealth: Payer: Self-pay | Admitting: *Deleted

## 2014-03-12 NOTE — Telephone Encounter (Signed)
Sent request to Dr Quincy Simmonds to review

## 2014-03-12 NOTE — Telephone Encounter (Signed)
Patient calling in regards to [facility] emails that she's been going back and forth with Dr. Charlies Constable in regards to her surgery. Dr. Charlies Constable said that she would refer her to one of the other partners here and patient would like to know next step so she can get the ball rolling.  Please advise.

## 2014-03-12 NOTE — Telephone Encounter (Signed)
Patient notified that this has been sent to Dr. Ozella Almond and once Dr. Quincy Simmonds reviews records someone will call her with the next step.

## 2014-03-14 ENCOUNTER — Telehealth: Payer: Self-pay | Admitting: Obstetrics and Gynecology

## 2014-03-14 NOTE — Telephone Encounter (Signed)
Call to patient, consult with Dr Quincy Simmonds scheduled for 03-21-14 at 3pm,   Routing to provider for final review. Patient agreeable to disposition. Will close encounter

## 2014-03-14 NOTE — Telephone Encounter (Signed)
Patient has been waiting on call to scheduled surgery with Dr.Silva. Patient is anxious to get this scheduled.

## 2014-03-19 ENCOUNTER — Telehealth: Payer: Self-pay | Admitting: Emergency Medicine

## 2014-03-19 ENCOUNTER — Encounter: Payer: Self-pay | Admitting: Genetic Counselor

## 2014-03-19 NOTE — Telephone Encounter (Signed)
-----   Message from [place], MD sent at 03/13/2014  9:51 PM EDT ----- Regarding: Please contact patient - See My Chart messge sent below Please contact patient regarding the plan below which was send through My Chart.  Dr. Charlies Constable has made a referral for me to see the patient.  "Hello Ms. Wetherby,  I will be happy to see you again and discuss ovarian and tubal removal.  This would make sense if you have a genetic alteration that increases the risk of ovarian cancer.  I will have the office contact you to get a copy of your test results and then schedule an appointment with me.  Thank you,   Josefa Half, MD"

## 2014-03-19 NOTE — Telephone Encounter (Signed)
Patient is scheduled for 03/21/14 with Dr. Quincy Simmonds, scheduled by Lamont Snowball, RN.   Genetic testing results are located under encounter date 02/26/14 with genetic counselor.  Will close encounter.

## 2014-03-21 ENCOUNTER — Ambulatory Visit (INDEPENDENT_AMBULATORY_CARE_PROVIDER_SITE_OTHER): Payer: Managed Care, Other (non HMO) | Admitting: Obstetrics and Gynecology

## 2014-03-21 ENCOUNTER — Encounter: Payer: Self-pay | Admitting: Obstetrics and Gynecology

## 2014-03-21 VITALS — BP 100/60 | HR 76 | Ht 67.75 in | Wt 189.0 lb

## 2014-03-21 DIAGNOSIS — G8929 Other chronic pain: Secondary | ICD-10-CM

## 2014-03-21 DIAGNOSIS — N8320 Unspecified ovarian cysts: Secondary | ICD-10-CM

## 2014-03-21 DIAGNOSIS — N83202 Unspecified ovarian cyst, left side: Principal | ICD-10-CM

## 2014-03-21 DIAGNOSIS — N644 Mastodynia: Secondary | ICD-10-CM

## 2014-03-21 DIAGNOSIS — N83201 Unspecified ovarian cyst, right side: Secondary | ICD-10-CM

## 2014-03-21 MED ORDER — AMITRIPTYLINE HCL 25 MG PO TABS: 25.0000 mg | ORAL_TABLET | Freq: Every day | ORAL | Status: DC

## 2014-03-21 MED ORDER — GABAPENTIN 400 MG PO CAPS: 400.0000 mg | ORAL_CAPSULE | Freq: Three times a day (TID) | ORAL | Status: DC
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
## 2014-03-21 NOTE — Patient Instructions (Signed)
We will call you to schedule the ultrasound to recheck your ovaries!

## 2014-03-22 ENCOUNTER — Telehealth: Payer: Self-pay | Admitting: Obstetrics and Gynecology

## 2014-03-22 NOTE — Telephone Encounter (Signed)
Spoke with patient. Advised that per benefit quote received, she will be responsible to pay a $30 copay when she comes in for PUS. Patient agreeable. Scheduled PUS. Advised patient of 72 hour cancellation policy and $382 cancellation fee. Patient agreeable.

## 2014-03-29 ENCOUNTER — Encounter: Payer: Self-pay | Admitting: Obstetrics and Gynecology

## 2014-03-29 ENCOUNTER — Ambulatory Visit (INDEPENDENT_AMBULATORY_CARE_PROVIDER_SITE_OTHER): Payer: Managed Care, Other (non HMO)

## 2014-03-29 ENCOUNTER — Ambulatory Visit (INDEPENDENT_AMBULATORY_CARE_PROVIDER_SITE_OTHER): Payer: Managed Care, Other (non HMO) | Admitting: Obstetrics and Gynecology

## 2014-03-29 VITALS — BP 102/66 | Resp 16 | Ht 67.75 in | Wt 193.0 lb

## 2014-03-29 DIAGNOSIS — N941 Dyspareunia: Secondary | ICD-10-CM

## 2014-03-29 DIAGNOSIS — N8320 Unspecified ovarian cysts: Secondary | ICD-10-CM

## 2014-03-29 DIAGNOSIS — N83201 Unspecified ovarian cyst, right side: Secondary | ICD-10-CM

## 2014-03-29 DIAGNOSIS — N83202 Unspecified ovarian cyst, left side: Principal | ICD-10-CM

## 2014-03-29 DIAGNOSIS — IMO0002 Reserved for concepts with insufficient information to code with codable children: Secondary | ICD-10-CM

## 2014-03-29 MED ORDER — ESTROGENS, CONJUGATED 0.625 MG/GM VA CREA: TOPICAL_CREAM | VAGINAL | Status: DC

## 2014-03-29 NOTE — Progress Notes (Signed)
  Subjective  Patient here today for pelvic ultrasound for evaluation of dyspareunia. No pain otherwise. Status post robotic hysterectomy.  Ovaries remain.  History of ovarian cyst noted on pelvic ultrasound 12/19/13. Left ovary with 2.1 cm cyst with debris and right ovary 2.5 cm cyst with debris.  Objective  Pelvic ultrasound - images and report reviewed with patient.  Uterus absent. R Right ovary with 10 mm follicle. Left ovary with 21 mm CL cyst.  No free fluid.  No mass at vaginal cuff.       Assessment   Dyspareunia. Normal pelvic ultrasound. Breast pain resolved on Elavil and Neurontin.   Plan  Discussion of normal findings and reassurance given.  Also told that fibrosis and small endometriosis implants are not visible with imaging studies and can be detected only through surgical exploration.  I asked patient if she would be open to trying local vaginal estrogen therapy.  I discussed potential risks of breast pain, nausea, increased risk of DVT, PE, MI, stroke with estrogen use, and breast cancer.  All of that being said, we reviewed how the dosages of the local treatment is exceedingly low and that the cream is probable the best way to reduce the dosage and use overall.  Will try Premarin 1/2 gm pv three time a week for 2 weeks and then use two times a week.  Disp 42.5 grams.  RF one.  Patient given Premarin cream coupon.  Will use water based lubricant at the time of intercourse.  Will follow up in 2 months for a recheck.   25 minutes face to face time of which over 50% was spent in counseling.   After visit summary to patient.

## 2014-05-24 ENCOUNTER — Other Ambulatory Visit: Payer: Self-pay

## 2014-05-24 DIAGNOSIS — Z1231 Encounter for screening mammogram for malignant neoplasm of breast: Secondary | ICD-10-CM

## 2014-05-28 ENCOUNTER — Telehealth: Payer: Self-pay | Admitting: Obstetrics and Gynecology

## 2014-05-28 NOTE — Telephone Encounter (Signed)
Patient canceled her 2 month recheck appointment. 05/31/14. Patient says she is doing fine and does not wish to reschedule.

## 2014-05-31 ENCOUNTER — Ambulatory Visit: Payer: Managed Care, Other (non HMO) | Admitting: Obstetrics and Gynecology

## 2014-06-01 NOTE — Telephone Encounter (Signed)
Thank you for the update.  I have closed the encounter.  

## 2014-06-01 NOTE — Telephone Encounter (Signed)
Dr. Quincy Simmonds, patient cancelled 2 month f/u appointment for 05-31-14 stating she is feeling fine and doesn't wish to reschedule at this time.  FYI

## 2014-06-07 ENCOUNTER — Ambulatory Visit
Admission: RE | Admit: 2014-06-07 | Discharge: 2014-06-07 | Disposition: A | Discharge status: Home / Self Care | Payer: Managed Care, Other (non HMO) | Source: Ambulatory Visit

## 2014-06-07 DIAGNOSIS — Z1231 Encounter for screening mammogram for malignant neoplasm of breast: Secondary | ICD-10-CM

## 2014-08-04 ENCOUNTER — Other Ambulatory Visit: Payer: Self-pay | Admitting: Obstetrics and Gynecology

## 2014-08-06 NOTE — Telephone Encounter (Signed)
Medication refill request: Premarin Vaginal Cream Last AEX:  12/13/13 TL Next AEX: 12/17/14 Dr. Quincy Simmonds Last MMG (if hormonal medication request): 06/11/14 [id number]:Neg Refill authorized: 03/29/14 #42.5g/1R Today please advise.

## 2014-10-26 DIAGNOSIS — M25469 Effusion, unspecified knee: Secondary | ICD-10-CM | POA: Insufficient documentation

## 2014-10-26 DIAGNOSIS — S86919A Strain of unspecified muscle(s) and tendon(s) at lower leg level, unspecified leg, initial encounter: Secondary | ICD-10-CM | POA: Insufficient documentation

## 2014-12-06 ENCOUNTER — Other Ambulatory Visit: Payer: Self-pay | Admitting: Obstetrics and Gynecology

## 2014-12-06 NOTE — Telephone Encounter (Signed)
Medication refill request: Amitriptyline HCL 25 mg ; Neurontin 400 mg  Last AEX:  12/13/13 with TL  Next AEX: 12/17/14 with BS Last MMG (if hormonal medication request): N/A Refill authorized: #90/0 rfs ; #270/0 rfs

## 2014-12-17 ENCOUNTER — Other Ambulatory Visit: Payer: Self-pay | Admitting: Obstetrics and Gynecology

## 2014-12-17 ENCOUNTER — Ambulatory Visit: Payer: Managed Care, Other (non HMO) | Admitting: Gynecology

## 2014-12-17 ENCOUNTER — Ambulatory Visit (INDEPENDENT_AMBULATORY_CARE_PROVIDER_SITE_OTHER): Payer: Managed Care, Other (non HMO) | Admitting: Obstetrics and Gynecology

## 2014-12-17 ENCOUNTER — Encounter: Payer: Self-pay | Admitting: Obstetrics and Gynecology

## 2014-12-17 VITALS — BP 118/64 | HR 64 | Resp 18 | Ht 66.25 in | Wt 205.8 lb

## 2014-12-17 DIAGNOSIS — R7309 Other abnormal glucose: Secondary | ICD-10-CM

## 2014-12-17 DIAGNOSIS — Z01419 Encounter for gynecological examination (general) (routine) without abnormal findings: Secondary | ICD-10-CM | POA: Diagnosis not present

## 2014-12-17 DIAGNOSIS — Z0000 Encounter for general adult medical examination without abnormal findings: Secondary | ICD-10-CM

## 2014-12-17 DIAGNOSIS — E785 Hyperlipidemia, unspecified: Secondary | ICD-10-CM

## 2014-12-17 DIAGNOSIS — R7989 Other specified abnormal findings of blood chemistry: Secondary | ICD-10-CM

## 2014-12-17 DIAGNOSIS — N393 Stress incontinence (female) (male): Secondary | ICD-10-CM

## 2014-12-17 DIAGNOSIS — Z23 Encounter for immunization: Secondary | ICD-10-CM

## 2014-12-17 HISTORY — DX: Other specified abnormal findings of blood chemistry: R79.89

## 2014-12-17 HISTORY — DX: Other abnormal glucose: R73.09

## 2014-12-17 HISTORY — DX: Hyperlipidemia, unspecified: E78.5

## 2014-12-17 LAB — POCT URINALYSIS DIPSTICK
BILIRUBIN UA: NEGATIVE
Ketones, UA: NEGATIVE
Leukocytes, UA: NEGATIVE
NITRITE UA: NEGATIVE
PH UA: 5
Protein, UA: NEGATIVE
Urobilinogen, UA: NEGATIVE

## 2014-12-17 MED ORDER — GABAPENTIN 400 MG PO CAPS: 400.0000 mg | ORAL_CAPSULE | Freq: Three times a day (TID) | ORAL | Status: DC

## 2014-12-17 MED ORDER — AMITRIPTYLINE HCL 25 MG PO TABS: 25.0000 mg | ORAL_TABLET | Freq: Every day | ORAL | Status: DC
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
## 2014-12-18 ENCOUNTER — Other Ambulatory Visit: Payer: Self-pay | Admitting: Obstetrics and Gynecology

## 2014-12-18 ENCOUNTER — Encounter: Payer: Self-pay | Admitting: Obstetrics and Gynecology

## 2014-12-18 DIAGNOSIS — E559 Vitamin D deficiency, unspecified: Secondary | ICD-10-CM

## 2014-12-18 LAB — COMPREHENSIVE METABOLIC PANEL
ALT: 25 U/L (ref 6–29)
AST: 18 U/L (ref 10–35)
Albumin: 4.1 g/dL (ref 3.6–5.1)
Alkaline Phosphatase: 60 U/L (ref 33–115)
BUN: 10 mg/dL (ref 7–25)
CHLORIDE: 101 mmol/L (ref 98–110)
CO2: 30 mmol/L (ref 20–31)
Calcium: 9.1 mg/dL (ref 8.6–10.2)
Creat: 0.82 mg/dL (ref 0.50–1.10)
GLUCOSE: 91 mg/dL (ref 65–99)
Potassium: 4.1 mmol/L (ref 3.5–5.3)
SODIUM: 142 mmol/L (ref 135–146)
Total Bilirubin: 0.2 mg/dL (ref 0.2–1.2)
Total Protein: 6.4 g/dL (ref 6.1–8.1)

## 2014-12-18 LAB — HEMOGLOBIN A1C
HEMOGLOBIN A1C: 5.8 % — AB (ref <5.7)
MEAN PLASMA GLUCOSE: 120 mg/dL — AB (ref <117)

## 2014-12-18 LAB — CBC
HCT: 41.6 % (ref 36.0–46.0)
Hemoglobin: 13.5 g/dL (ref 12.0–15.0)
MCH: 30.8 pg (ref 26.0–34.0)
MCHC: 32.5 g/dL (ref 30.0–36.0)
MCV: 94.8 fL (ref 78.0–100.0)
MPV: 11.3 fL (ref 8.6–12.4)
Platelets: 233 10*3/uL (ref 150–400)
RBC: 4.39 MIL/uL (ref 3.87–5.11)
RDW: 14.2 % (ref 11.5–15.5)
WBC: 9 10*3/uL (ref 4.0–10.5)

## 2014-12-18 LAB — LIPID PANEL
Cholesterol: 188 mg/dL (ref 125–200)
HDL: 33 mg/dL — AB (ref >=46)
LDL Cholesterol: 104 mg/dL (ref <130)
Total CHOL/HDL Ratio: 5.7 Ratio — ABNORMAL HIGH (ref <=5.0)
Triglycerides: 253 mg/dL — ABNORMAL HIGH (ref <150)
VLDL: 51 mg/dL — AB (ref <30)

## 2014-12-18 LAB — TSH: TSH: 5.087 u[IU]/mL — AB (ref 0.350–4.500)

## 2014-12-18 LAB — VITAMIN D 25 HYDROXY (VIT D DEFICIENCY, FRACTURES): Vit D, 25-Hydroxy: 24 ng/mL — ABNORMAL LOW (ref 30–100)

## 2014-12-18 MED ORDER — VITAMIN D (ERGOCALCIFEROL) 1.25 MG (50000 UNIT) PO CAPS: 50000.0000 [IU] | ORAL_CAPSULE | ORAL | Status: DC

## 2014-12-19 ENCOUNTER — Encounter: Payer: Self-pay | Admitting: Obstetrics and Gynecology

## 2014-12-19 ENCOUNTER — Other Ambulatory Visit: Payer: Self-pay | Admitting: Obstetrics and Gynecology

## 2014-12-19 DIAGNOSIS — R7989 Other specified abnormal findings of blood chemistry: Secondary | ICD-10-CM

## 2014-12-19 DIAGNOSIS — R32 Unspecified urinary incontinence: Secondary | ICD-10-CM

## 2014-12-19 LAB — HEMOGLOBIN, FINGERSTICK: Hemoglobin, fingerstick: 13.9 g/dL (ref 12.0–16.0)

## 2014-12-19 LAB — T4, FREE: FREE T4: 0.99 ng/dL (ref 0.80–1.80)

## 2014-12-19 LAB — T3: T3 TOTAL: 145 ng/dL (ref 80.0–204.0)

## 2014-12-28 ENCOUNTER — Encounter: Payer: Self-pay | Admitting: Family Medicine

## 2014-12-28 ENCOUNTER — Ambulatory Visit (INDEPENDENT_AMBULATORY_CARE_PROVIDER_SITE_OTHER): Payer: Managed Care, Other (non HMO) | Admitting: Family Medicine

## 2014-12-28 VITALS — BP 120/73 | HR 85 | Temp 98.4°F | Resp 17 | Ht 66.0 in | Wt 200.0 lb

## 2014-12-28 DIAGNOSIS — A09 Infectious gastroenteritis and colitis, unspecified: Secondary | ICD-10-CM

## 2014-12-28 DIAGNOSIS — K529 Noninfective gastroenteritis and colitis, unspecified: Secondary | ICD-10-CM
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
## 2015-01-05 ENCOUNTER — Other Ambulatory Visit: Payer: Self-pay | Admitting: Obstetrics and Gynecology

## 2015-01-07 NOTE — Telephone Encounter (Signed)
Gabapentin #270/3 rfs and Amtriptyline 25 mg #90/3 rfs sent to [facility] [address]- rx denied.

## 2015-01-09 ENCOUNTER — Telehealth: Payer: Self-pay | Admitting: Obstetrics and Gynecology

## 2015-01-09 NOTE — Telephone Encounter (Signed)
Call to patient to review benefits information for urodynamics procedure. Patient agreeable and schedule for 01/30/15 at 9:30am.   Routing to [employer] to contact patient with instructions.

## 2015-01-11 NOTE — Telephone Encounter (Signed)
Patient called requesting to cancel appointment for urodynamics scheduled for 01/30/15. Patient stated she did not wish to reschedule at this time. Order deferred until further notification from clinical. Route to sally for review

## 2015-01-11 NOTE — Telephone Encounter (Signed)
Follow-up call to patient regarding canceled urodynamic testing. Patient denies questions or concerns.  States she and husband have discussed further and have decided that although her symptoms are bothersome; they are bad enough to undergo major surgery. Offered non-surgical options. Patient states Dr Quincy Simmonds has reviewed these with her in detail and she states she is doing the exercises. Declines any other options at this time.  Advised that is fine. Call back in the future if desires any treatment for this. Order discontinued.  Routing to provider for final review. Will close encounter.

## 2015-01-16 ENCOUNTER — Other Ambulatory Visit: Payer: Self-pay | Admitting: Otolaryngology

## 2015-01-16 ENCOUNTER — Ambulatory Visit
Admission: RE | Admit: 2015-01-16 | Discharge: 2015-01-16 | Disposition: A | Discharge status: Home / Self Care | Payer: Managed Care, Other (non HMO) | Source: Ambulatory Visit | Attending: Otolaryngology | Admitting: Otolaryngology

## 2015-01-16 DIAGNOSIS — R05 Cough: Secondary | ICD-10-CM | POA: Diagnosis present

## 2015-01-16 DIAGNOSIS — R059 Cough, unspecified: Secondary | ICD-10-CM

## 2015-01-17 ENCOUNTER — Telehealth: Payer: Self-pay | Admitting: Gastroenterology

## 2015-01-17 NOTE — Telephone Encounter (Signed)
Left voice message for patient to call and schedule appointment with Dr. Allen Norris for reflux/cough. Notes under Media

## 2015-01-30 ENCOUNTER — Ambulatory Visit: Payer: Managed Care, Other (non HMO)

## 2015-02-22 ENCOUNTER — Other Ambulatory Visit: Payer: Self-pay

## 2015-02-25 ENCOUNTER — Other Ambulatory Visit: Payer: Self-pay

## 2015-02-25 ENCOUNTER — Encounter: Payer: Self-pay | Admitting: Gastroenterology

## 2015-02-25 ENCOUNTER — Ambulatory Visit (INDEPENDENT_AMBULATORY_CARE_PROVIDER_SITE_OTHER): Payer: Managed Care, Other (non HMO) | Admitting: Gastroenterology

## 2015-02-25 VITALS — BP 115/75 | HR 75 | Temp 98.4°F | Ht 67.0 in | Wt 201.0 lb

## 2015-02-25 DIAGNOSIS — K219 Gastro-esophageal reflux disease without esophagitis: Secondary | ICD-10-CM | POA: Diagnosis not present
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
## 2015-02-28 ENCOUNTER — Encounter: Payer: Self-pay | Admitting: *Deleted

## 2015-03-07 ENCOUNTER — Ambulatory Visit: Payer: Managed Care, Other (non HMO) | Admitting: Anesthesiology

## 2015-03-07 ENCOUNTER — Other Ambulatory Visit: Payer: Self-pay | Admitting: Gastroenterology

## 2015-03-07 ENCOUNTER — Encounter
Admission: RE | Disposition: A | Discharge status: Home / Self Care | Payer: Self-pay | Source: Ambulatory Visit | Attending: Gastroenterology

## 2015-03-07 ENCOUNTER — Ambulatory Visit
Admission: RE | Admit: 2015-03-07 | Discharge: 2015-03-07 | Disposition: A | Discharge status: Home / Self Care | Payer: Managed Care, Other (non HMO) | Source: Ambulatory Visit | Attending: Gastroenterology | Admitting: Gastroenterology

## 2015-03-07 DIAGNOSIS — E785 Hyperlipidemia, unspecified: Secondary | ICD-10-CM | POA: Diagnosis not present

## 2015-03-07 DIAGNOSIS — Z8249 Family history of ischemic heart disease and other diseases of the circulatory system: Secondary | ICD-10-CM | POA: Diagnosis not present

## 2015-03-07 DIAGNOSIS — F17210 Nicotine dependence, cigarettes, uncomplicated: Secondary | ICD-10-CM | POA: Diagnosis not present

## 2015-03-07 DIAGNOSIS — Z9889 Other specified postprocedural states: Secondary | ICD-10-CM | POA: Insufficient documentation

## 2015-03-07 DIAGNOSIS — Z8049 Family history of malignant neoplasm of other genital organs: Secondary | ICD-10-CM | POA: Diagnosis not present

## 2015-03-07 DIAGNOSIS — Z90710 Acquired absence of both cervix and uterus: Secondary | ICD-10-CM | POA: Diagnosis not present

## 2015-03-07 DIAGNOSIS — R12 Heartburn: Secondary | ICD-10-CM | POA: Insufficient documentation

## 2015-03-07 DIAGNOSIS — Z8349 Family history of other endocrine, nutritional and metabolic diseases: Secondary | ICD-10-CM | POA: Diagnosis not present

## 2015-03-07 DIAGNOSIS — N946 Dysmenorrhea, unspecified: Secondary | ICD-10-CM | POA: Diagnosis not present

## 2015-03-07 DIAGNOSIS — K2970 Gastritis, unspecified, without bleeding: Secondary | ICD-10-CM | POA: Diagnosis not present

## 2015-03-07 DIAGNOSIS — F319 Bipolar disorder, unspecified: Secondary | ICD-10-CM | POA: Diagnosis not present

## 2015-03-07 DIAGNOSIS — Z833 Family history of diabetes mellitus: Secondary | ICD-10-CM | POA: Insufficient documentation

## 2015-03-07 DIAGNOSIS — Z9049 Acquired absence of other specified parts of digestive tract: Secondary | ICD-10-CM | POA: Diagnosis not present

## 2015-03-07 DIAGNOSIS — K219 Gastro-esophageal reflux disease without esophagitis: Secondary | ICD-10-CM | POA: Diagnosis not present

## 2015-03-07 DIAGNOSIS — Z803 Family history of malignant neoplasm of breast: Secondary | ICD-10-CM | POA: Insufficient documentation

## 2015-03-07 DIAGNOSIS — Z79899 Other long term (current) drug therapy: Secondary | ICD-10-CM | POA: Insufficient documentation

## 2015-03-07 HISTORY — PX: ESOPHAGOGASTRODUODENOSCOPY (EGD) WITH PROPOFOL: SHX5813

## 2015-03-07 HISTORY — DX: Gastro-esophageal reflux disease without esophagitis: K21.9

## 2015-03-07 SURGERY — ESOPHAGOGASTRODUODENOSCOPY (EGD) WITH PROPOFOL
Anesthesia: Monitor Anesthesia Care | Wound class: Clean Contaminated

## 2015-03-07 MED ORDER — LACTATED RINGERS IV SOLN: 500.0000 mL | INTRAVENOUS | Status: DC

## 2015-03-07 MED ORDER — OXYCODONE HCL 5 MG/5ML PO SOLN
5.0000 mg | Freq: Once | ORAL | Status: DC | PRN
Start: 2015-03-07 — End: 2015-03-07

## 2015-03-07 MED ORDER — ACETAMINOPHEN 160 MG/5ML PO SOLN: 325.0000 mg | ORAL | Status: DC | PRN

## 2015-03-07 MED ORDER — GLYCOPYRROLATE 0.2 MG/ML IJ SOLN
INTRAMUSCULAR | Status: DC | PRN
  Administered 2015-03-07: 0.2 mg via INTRAVENOUS

## 2015-03-07 MED ORDER — LACTATED RINGERS IV SOLN
INTRAVENOUS | Status: DC
  Administered 2015-03-07 (×2): via INTRAVENOUS

## 2015-03-07 MED ORDER — DEXAMETHASONE SODIUM PHOSPHATE 4 MG/ML IJ SOLN: 8.0000 mg | Freq: Once | INTRAMUSCULAR | Status: DC | PRN

## 2015-03-07 MED ORDER — PROPOFOL 10 MG/ML IV BOLUS
INTRAVENOUS | Status: DC | PRN
  Administered 2015-03-07: 50 mg via INTRAVENOUS
  Administered 2015-03-07: 100 mg via INTRAVENOUS

## 2015-03-07 MED ORDER — LIDOCAINE HCL (CARDIAC) 20 MG/ML IV SOLN
INTRAVENOUS | Status: DC | PRN
  Administered 2015-03-07: 30 mg via INTRAVENOUS

## 2015-03-07 MED ORDER — FENTANYL CITRATE (PF) 100 MCG/2ML IJ SOLN
25.0000 ug | INTRAMUSCULAR | Status: DC | PRN
Start: 2015-03-07 — End: 2015-03-07

## 2015-03-07 MED ORDER — OXYCODONE HCL 5 MG PO TABS
5.0000 mg | ORAL_TABLET | Freq: Once | ORAL | Status: DC | PRN
Start: 2015-03-07 — End: 2015-03-07

## 2015-03-07 MED ORDER — ACETAMINOPHEN 325 MG PO TABS: 325.0000 mg | ORAL_TABLET | ORAL | Status: DC | PRN

## 2015-03-07 SURGICAL SUPPLY — 39 items
BALLN DILATOR 10-12 8 (BALLOONS)
BALLN DILATOR 12-15 8 (BALLOONS)
BALLN DILATOR 15-18 8 (BALLOONS)
BALLN DILATOR CRE 0-12 8 (BALLOONS)
BALLN DILATOR ESOPH 8 10 CRE (MISCELLANEOUS) IMPLANT
BALLOON DILATOR 12-15 8 (BALLOONS) IMPLANT
BALLOON DILATOR 15-18 8 (BALLOONS) IMPLANT
BALLOON DILATOR CRE 0-12 8 (BALLOONS) IMPLANT
BLOCK BITE 60FR ADLT L/F GRN (MISCELLANEOUS) ×2 IMPLANT
CANISTER SUCT 1200ML W/VALVE (MISCELLANEOUS) ×2 IMPLANT
FCP ESCP3.2XJMB 240X2.8X (MISCELLANEOUS)
FORCEPS BIOP RAD 4 LRG CAP 4 (CUTTING FORCEPS) ×2 IMPLANT
FORCEPS BIOP RJ4 240 W/NDL (MISCELLANEOUS)
FORCEPS ESCP3.2XJMB 240X2.8X (MISCELLANEOUS) IMPLANT
GOWN CVR UNV OPN BCK APRN NK (MISCELLANEOUS) ×2 IMPLANT
GOWN ISOL THUMB LOOP REG UNIV (MISCELLANEOUS) ×2
HEMOCLIP INSTINCT (CLIP) IMPLANT
INJECTOR VARIJECT VIN23 (MISCELLANEOUS) IMPLANT
KIT CO2 TUBING (TUBING) IMPLANT
KIT DEFENDO VALVE AND CONN (KITS) IMPLANT
KIT ENDO PROCEDURE OLY (KITS) ×2 IMPLANT
LIGATOR MULTIBAND 6SHOOTER MBL (MISCELLANEOUS) IMPLANT
MARKER SPOT ENDO TATTOO 5ML (MISCELLANEOUS) IMPLANT
PAD GROUND ADULT SPLIT (MISCELLANEOUS) IMPLANT
SNARE SHORT THROW 13M SML OVAL (MISCELLANEOUS) IMPLANT
SNARE SHORT THROW 30M LRG OVAL (MISCELLANEOUS) IMPLANT
SPOT EX ENDOSCOPIC TATTOO (MISCELLANEOUS)
SUCTION POLY TRAP 4CHAMBER (MISCELLANEOUS) IMPLANT
SYR INFLATION 60ML (SYRINGE) IMPLANT
TRAP SUCTION POLY (MISCELLANEOUS) IMPLANT
TUBING CONN 6MMX3.1M (TUBING)
TUBING SUCTION CONN 0.25 STRL (TUBING) IMPLANT
UNDERPAD 30X60 958B10 (PK) (MISCELLANEOUS) IMPLANT
VALVE BIOPSY ENDO (VALVE) IMPLANT
VARIJECT INJECTOR VIN23 (MISCELLANEOUS)
WATER AUXILLARY (MISCELLANEOUS) IMPLANT
WATER STERILE IRR 250ML POUR (IV SOLUTION) ×2 IMPLANT
WATER STERILE IRR 500ML POUR (IV SOLUTION) IMPLANT
WIRE CRE 18-20MM 8CM F G (MISCELLANEOUS) IMPLANT
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
## 2015-03-07 NOTE — Anesthesia Preprocedure Evaluation (Signed)
Anesthesia Evaluation  Patient identified by MRN, date of birth, ID band Patient awake    Reviewed: Allergy & Precautions, H&P , NPO status , Patient's Chart, lab work & pertinent test results, reviewed documented beta blocker date and time   Airway Mallampati: II  TM Distance: >3 FB Neck ROM: full    Dental no notable dental hx.    Pulmonary Current Smoker,    Pulmonary exam normal breath sounds clear to auscultation       Cardiovascular Exercise Tolerance: Good negative cardio ROS   Rhythm:regular Rate:Normal     Neuro/Psych negative neurological ROS  negative psych ROS   GI/Hepatic Neg liver ROS, GERD  Medicated,  Endo/Other  negative endocrine ROS  Renal/GU negative Renal ROS  negative genitourinary   Musculoskeletal   Abdominal   Peds  Hematology negative hematology ROS (+)   Anesthesia Other Findings   Reproductive/Obstetrics negative OB ROS                             Anesthesia Physical Anesthesia Plan  ASA: II  Anesthesia Plan: MAC   Post-op Pain Management:    Induction:   Airway Management Planned:   Additional Equipment:   Intra-op Plan:   Post-operative Plan:   Informed Consent: I have reviewed the patients History and Physical, chart, labs and discussed the procedure including the risks, benefits and alternatives for the proposed anesthesia with the patient or authorized representative who has indicated his/her understanding and acceptance.     Plan Discussed with: CRNA  Anesthesia Plan Comments:         Anesthesia Quick Evaluation

## 2015-03-07 NOTE — Discharge Instructions (Signed)

## 2015-03-07 NOTE — Anesthesia Postprocedure Evaluation (Signed)
  Anesthesia Post-op Note  Patient: Alicia Long  Procedure(s) Performed: Procedure(s): ESOPHAGOGASTRODUODENOSCOPY (EGD) WITH PROPOFOL (N/A)  Anesthesia type:MAC  Patient location: PACU  Post pain: Pain level controlled  Post assessment: Post-op Vital signs reviewed, Patient's Cardiovascular Status Stable, Respiratory Function Stable, Patent Airway and No signs of Nausea or vomiting  Post vital signs: Reviewed and stable  Last Vitals:  Filed Vitals:   03/07/15 0907  BP: 104/69  Pulse: 82  Temp:   Resp: 22    Level of consciousness: awake, alert  and patient cooperative  Complications: No apparent anesthesia complications

## 2015-03-07 NOTE — Anesthesia Procedure Notes (Signed)
Procedure Name: MAC Performed by: Mordechai Matuszak Pre-anesthesia Checklist: Patient identified, Emergency Drugs available, Suction available, Patient being monitored and Timeout performed Patient Re-evaluated:Patient Re-evaluated prior to inductionOxygen Delivery Method: Nasal cannula       

## 2015-03-07 NOTE — Op Note (Signed)
[facility] Gastroenterology Patient Name: Alicia Long Procedure Date: 03/07/2015 8:42 AM MRN: [id number] Account #: [id number] Date of Birth: [birth year] Admit Type: Outpatient Age: 49 Room: [facility] OR ROOM 01 Gender: Female Note Status: Finalized Procedure:         Upper GI endoscopy Indications:       Heartburn Providers:         Lucilla Lame, MD Referring MD:      Otila Back. Manuella Ghazi (Referring MD) Medicines:         Propofol per Anesthesia Complications:     No immediate complications. Procedure:         Pre-Anesthesia Assessment:                    - Prior to the procedure, a History and Physical was                     performed, and patient medications and allergies were                     reviewed. The patient's tolerance of previous anesthesia                     was also reviewed. The risks and benefits of the procedure                     and the sedation options and risks were discussed with the                     patient. All questions were answered, and informed consent                     was obtained. Prior Anticoagulants: The patient has taken                     no previous anticoagulant or antiplatelet agents. ASA                     Grade Assessment: II - A patient with mild systemic                     disease. After reviewing the risks and benefits, the                     patient was deemed in satisfactory condition to undergo                     the procedure.                    After obtaining informed consent, the endoscope was passed                     under direct vision. Throughout the procedure, the                     patient's blood pressure, pulse, and oxygen saturations                     were monitored continuously. The Olympus GIF H180J                     endoscope (S#: [id number]) was introduced through the mouth,  and advanced to the second part of duodenum. The upper GI                     endoscopy was  accomplished without difficulty. The patient                     tolerated the procedure well. Findings:      The examined esophagus was normal. Biopsies were taken with a cold       forceps for histology.      Localized minimal inflammation characterized by erythema was found in       the gastric antrum. Biopsies were taken with a cold forceps for       histology.      The examined duodenum was normal. Impression:        - Normal esophagus. Biopsied.                    - Gastritis. Biopsied.                    - Normal examined duodenum. Recommendation:    - Await pathology results. Procedure Code(s): --- Professional ---                    [phone], Esophagogastroduodenoscopy, flexible, transoral;                     with biopsy, single or multiple Diagnosis Code(s): --- Professional ---                    R12, Heartburn                    K29.70, Gastritis, unspecified, without bleeding CPT copyright 2014 American Medical Association. All rights reserved. The codes documented in this report are preliminary and upon coder review may  be revised to meet current compliance requirements. Lucilla Lame, MD 03/07/2015 8:55:07 AM This report has been signed electronically. Number of Addenda: 0 Note Initiated On: 03/07/2015 8:42 AM Total Procedure Duration: 0 hours 2 minutes 46 seconds       [facility]

## 2015-03-07 NOTE — Transfer of Care (Signed)
Immediate Anesthesia Transfer of Care Note  Patient: Alicia Long  Procedure(s) Performed: Procedure(s): ESOPHAGOGASTRODUODENOSCOPY (EGD) WITH PROPOFOL (N/A)  Patient Location: PACU  Anesthesia Type: MAC  Level of Consciousness: awake, alert  and patient cooperative  Airway and Oxygen Therapy: Patient Spontanous Breathing and Patient connected to supplemental oxygen  Post-op Assessment: Post-op Vital signs reviewed, Patient's Cardiovascular Status Stable, Respiratory Function Stable, Patent Airway and No signs of Nausea or vomiting  Post-op Vital Signs: Reviewed and stable  Complications: No apparent anesthesia complications

## 2015-03-08 ENCOUNTER — Encounter: Payer: Self-pay | Admitting: Gastroenterology

## 2015-03-13 ENCOUNTER — Telehealth: Payer: Self-pay

## 2015-03-13 NOTE — Telephone Encounter (Signed)
-----   Message from Lucilla Lame, MD sent at 03/13/2015  7:55 AM EDT ----- Lett he patient know that the biopdies were all normal. If she is till having a cough she should be set up for impedance with a pH study. No need for manometry.

## 2015-03-13 NOTE — Telephone Encounter (Signed)
LVM for pt to return my call.

## 2015-03-14 ENCOUNTER — Other Ambulatory Visit: Payer: Self-pay

## 2015-03-14 NOTE — Telephone Encounter (Signed)
-----   Message from Lucilla Lame, MD sent at 03/13/2015  7:55 AM EDT ----- Lett he patient know that the biopdies were all normal. If she is till having a cough she should be set up for impedance with a pH study. No need for manometry.

## 2015-03-14 NOTE — Telephone Encounter (Signed)
Pt notified of results. Pt still experiencing a lot of cough. Scheduled for a PH study at [facility] on Wed, Nov 16th. Mailed pt instructions.

## 2015-03-19 ENCOUNTER — Encounter: Payer: Self-pay | Admitting: Family Medicine

## 2015-03-19 ENCOUNTER — Ambulatory Visit (INDEPENDENT_AMBULATORY_CARE_PROVIDER_SITE_OTHER): Payer: Managed Care, Other (non HMO) | Admitting: Family Medicine

## 2015-03-19 VITALS — BP 112/70 | HR 82 | Temp 98.6°F | Resp 16 | Ht 67.0 in | Wt 204.8 lb

## 2015-03-19 DIAGNOSIS — F319 Bipolar disorder, unspecified: Secondary | ICD-10-CM | POA: Insufficient documentation

## 2015-03-19 DIAGNOSIS — F3162 Bipolar disorder, current episode mixed, moderate: Secondary | ICD-10-CM

## 2015-03-19 DIAGNOSIS — M79644 Pain in right finger(s): Secondary | ICD-10-CM | POA: Insufficient documentation
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
## 2015-03-20 LAB — CBC WITH DIFFERENTIAL/PLATELET
BASOS ABS: 0 10*3/uL (ref 0.0–0.2)
Basos: 0 %
EOS (ABSOLUTE): 0.3 10*3/uL (ref 0.0–0.4)
Eos: 2 %
HEMOGLOBIN: 13.7 g/dL (ref 11.1–15.9)
Hematocrit: 40.6 % (ref 34.0–46.6)
IMMATURE GRANS (ABS): 0 10*3/uL (ref 0.0–0.1)
IMMATURE GRANULOCYTES: 0 %
LYMPHS: 41 %
Lymphocytes Absolute: 5.4 10*3/uL — ABNORMAL HIGH (ref 0.7–3.1)
MCH: 31.1 pg (ref 26.6–33.0)
MCHC: 33.7 g/dL (ref 31.5–35.7)
MCV: 92 fL (ref 79–97)
MONOCYTES: 7 %
Monocytes Absolute: 0.9 10*3/uL (ref 0.1–0.9)
NEUTROS ABS: 6.7 10*3/uL (ref 1.4–7.0)
NEUTROS PCT: 50 %
Platelets: 243 10*3/uL (ref 150–379)
RBC: 4.41 x10E6/uL (ref 3.77–5.28)
RDW: 13.6 % (ref 12.3–15.4)
WBC: 13.4 10*3/uL — AB (ref 3.4–10.8)

## 2015-03-20 LAB — HEPATIC FUNCTION PANEL
ALT: 14 IU/L (ref 0–32)
AST: 15 IU/L (ref 0–40)
Albumin: 4.4 g/dL (ref 3.5–5.5)
Alkaline Phosphatase: 66 IU/L (ref 39–117)
Bilirubin Total: <0.2 mg/dL (ref 0.0–1.2)
Bilirubin, Direct: 0.07 mg/dL (ref 0.00–0.40)
TOTAL PROTEIN: 6.8 g/dL (ref 6.0–8.5)

## 2015-03-20 LAB — AMMONIA: AMMONIA: 78 ug/dL (ref 19–87)

## 2015-03-20 LAB — VALPROIC ACID LEVEL: VALPROIC ACID LVL: 52 ug/mL (ref 50–100)

## 2015-03-22 ENCOUNTER — Other Ambulatory Visit: Payer: Self-pay | Admitting: Family Medicine

## 2015-03-22 DIAGNOSIS — D72829 Elevated white blood cell count, unspecified: Secondary | ICD-10-CM

## 2015-04-03 ENCOUNTER — Encounter
Admission: RE | Disposition: A | Discharge status: Home / Self Care | Payer: Self-pay | Source: Ambulatory Visit | Attending: Gastroenterology

## 2015-04-03 ENCOUNTER — Ambulatory Visit
Admission: RE | Admit: 2015-04-03 | Discharge: 2015-04-03 | Disposition: A | Discharge status: Home / Self Care | Payer: Managed Care, Other (non HMO) | Source: Ambulatory Visit | Attending: Gastroenterology | Admitting: Gastroenterology

## 2015-04-03 DIAGNOSIS — R05 Cough: Secondary | ICD-10-CM | POA: Insufficient documentation

## 2015-04-03 HISTORY — PX: 24 HOUR PH STUDY: SHX5419

## 2015-04-03 HISTORY — PX: ESOPHAGEAL MANOMETRY: SHX5429

## 2015-04-03 SURGERY — MONITORING, ESOPHAGEAL PH, 24 HOUR

## 2015-04-03 MED ORDER — LIDOCAINE HCL 2 % EX GEL
1.0000 "application " | Freq: Once | CUTANEOUS | Status: AC
  Administered 2015-04-03: 3
  Filled 2015-04-03: qty 5

## 2015-04-03 MED ORDER — BUTAMBEN-TETRACAINE-BENZOCAINE 2-2-14 % EX AERO
1.0000 | INHALATION_SPRAY | Freq: Once | CUTANEOUS | Status: AC
  Administered 2015-04-03: 1 via TOPICAL
  Filled 2015-04-03: qty 20

## 2015-04-03 SURGICAL SUPPLY — 2 items
FACESHIELD LNG OPTICON STERILE (SAFETY) IMPLANT
GLOVE BIO SURGEON STRL SZ8 (GLOVE) ×4 IMPLANT

## 2015-04-04 ENCOUNTER — Other Ambulatory Visit: Payer: Self-pay | Admitting: Family Medicine

## 2015-04-04 ENCOUNTER — Encounter: Payer: Self-pay | Admitting: Gastroenterology

## 2015-04-05 LAB — CBC WITH DIFFERENTIAL/PLATELET
BASOS ABS: 0 10*3/uL (ref 0.0–0.2)
Basos: 0 %
EOS (ABSOLUTE): 0.3 10*3/uL (ref 0.0–0.4)
Eos: 3 %
Hematocrit: 42.5 % (ref 34.0–46.6)
Hemoglobin: 14.3 g/dL (ref 11.1–15.9)
IMMATURE GRANS (ABS): 0 10*3/uL (ref 0.0–0.1)
IMMATURE GRANULOCYTES: 0 %
LYMPHS: 45 %
Lymphocytes Absolute: 5.5 10*3/uL — ABNORMAL HIGH (ref 0.7–3.1)
MCH: 31.4 pg (ref 26.6–33.0)
MCHC: 33.6 g/dL (ref 31.5–35.7)
MCV: 93 fL (ref 79–97)
MONOS ABS: 1 10*3/uL — AB (ref 0.1–0.9)
Monocytes: 8 %
NEUTROS PCT: 44 %
Neutrophils Absolute: 5.3 10*3/uL (ref 1.4–7.0)
PLATELETS: 250 10*3/uL (ref 150–379)
RBC: 4.56 x10E6/uL (ref 3.77–5.28)
RDW: 13.8 % (ref 12.3–15.4)
WBC: 12.1 10*3/uL — AB (ref 3.4–10.8)

## 2015-04-18 ENCOUNTER — Other Ambulatory Visit: Payer: Self-pay

## 2015-04-18 DIAGNOSIS — R1310 Dysphagia, unspecified: Secondary | ICD-10-CM

## 2015-04-18 DIAGNOSIS — K311 Adult hypertrophic pyloric stenosis: Secondary | ICD-10-CM

## 2015-04-22 ENCOUNTER — Encounter: Payer: Self-pay | Admitting: *Deleted

## 2015-04-24 ENCOUNTER — Ambulatory Visit
Admission: RE | Admit: 2015-04-24 | Discharge: 2015-04-24 | Disposition: A | Discharge status: Home / Self Care | Payer: Managed Care, Other (non HMO) | Source: Ambulatory Visit | Attending: Surgery | Admitting: Surgery

## 2015-04-24 ENCOUNTER — Encounter
Admission: RE | Disposition: A | Discharge status: Home / Self Care | Payer: Self-pay | Source: Ambulatory Visit | Attending: Surgery

## 2015-04-24 ENCOUNTER — Ambulatory Visit: Payer: Managed Care, Other (non HMO) | Admitting: Anesthesiology

## 2015-04-24 ENCOUNTER — Telehealth: Payer: Self-pay

## 2015-04-24 DIAGNOSIS — F17210 Nicotine dependence, cigarettes, uncomplicated: Secondary | ICD-10-CM | POA: Diagnosis not present

## 2015-04-24 DIAGNOSIS — F319 Bipolar disorder, unspecified: Secondary | ICD-10-CM | POA: Insufficient documentation

## 2015-04-24 DIAGNOSIS — M65311 Trigger thumb, right thumb: Secondary | ICD-10-CM | POA: Insufficient documentation

## 2015-04-24 HISTORY — PX: TRIGGER FINGER RELEASE: SHX641

## 2015-04-24 SURGERY — MINOR RELEASE TRIGGER FINGER/A-1 PULLEY
Anesthesia: General | Laterality: Right | Wound class: Clean

## 2015-04-24 MED ORDER — OXYCODONE HCL 5 MG PO TABS: 5.0000 mg | ORAL_TABLET | Freq: Once | ORAL | Status: DC | PRN

## 2015-04-24 MED ORDER — METOCLOPRAMIDE HCL 5 MG/ML IJ SOLN: 5.0000 mg | Freq: Three times a day (TID) | INTRAMUSCULAR | Status: DC | PRN

## 2015-04-24 MED ORDER — CEFAZOLIN SODIUM-DEXTROSE 2-3 GM-% IV SOLR
2.0000 g | Freq: Once | INTRAVENOUS | Status: AC
  Administered 2015-04-24: 2 g via INTRAVENOUS

## 2015-04-24 MED ORDER — MIDAZOLAM HCL 5 MG/5ML IJ SOLN
INTRAMUSCULAR | Status: DC | PRN
  Administered 2015-04-24: 2 mg via INTRAVENOUS

## 2015-04-24 MED ORDER — FENTANYL CITRATE (PF) 100 MCG/2ML IJ SOLN
INTRAMUSCULAR | Status: DC | PRN
  Administered 2015-04-24: 50 ug via INTRAVENOUS

## 2015-04-24 MED ORDER — OXYCODONE HCL 5 MG/5ML PO SOLN: 5.0000 mg | Freq: Once | ORAL | Status: DC | PRN

## 2015-04-24 MED ORDER — BUPIVACAINE HCL (PF) 0.5 % IJ SOLN
INTRAMUSCULAR | Status: DC | PRN
  Administered 2015-04-24: 10 mL

## 2015-04-24 MED ORDER — ONDANSETRON HCL 4 MG/2ML IJ SOLN: 4.0000 mg | Freq: Four times a day (QID) | INTRAMUSCULAR | Status: DC | PRN

## 2015-04-24 MED ORDER — ONDANSETRON HCL 4 MG PO TABS: 4.0000 mg | ORAL_TABLET | Freq: Four times a day (QID) | ORAL | Status: DC | PRN

## 2015-04-24 MED ORDER — LACTATED RINGERS IV SOLN
INTRAVENOUS | Status: DC
  Administered 2015-04-24: 12:00:00 via INTRAVENOUS

## 2015-04-24 MED ORDER — ONDANSETRON HCL 4 MG/2ML IJ SOLN
INTRAMUSCULAR | Status: DC | PRN
  Administered 2015-04-24: 4 mg via INTRAVENOUS

## 2015-04-24 MED ORDER — PROPOFOL 10 MG/ML IV BOLUS
INTRAVENOUS | Status: DC | PRN
  Administered 2015-04-24: 200 mg via INTRAVENOUS

## 2015-04-24 MED ORDER — HYDROCODONE-ACETAMINOPHEN 5-325 MG PO TABS: 1.0000 | ORAL_TABLET | ORAL | Status: DC | PRN

## 2015-04-24 MED ORDER — FENTANYL CITRATE (PF) 100 MCG/2ML IJ SOLN: 25.0000 ug | INTRAMUSCULAR | Status: DC | PRN

## 2015-04-24 MED ORDER — HYDROCODONE-ACETAMINOPHEN 5-325 MG PO TABS: 1.0000 | ORAL_TABLET | Freq: Four times a day (QID) | ORAL | Status: DC | PRN

## 2015-04-24 MED ORDER — DEXAMETHASONE SODIUM PHOSPHATE 4 MG/ML IJ SOLN
INTRAMUSCULAR | Status: DC | PRN
  Administered 2015-04-24: 4 mg via INTRAVENOUS

## 2015-04-24 MED ORDER — LIDOCAINE HCL (CARDIAC) 20 MG/ML IV SOLN
INTRAVENOUS | Status: DC | PRN
  Administered 2015-04-24: 40 mg via INTRATRACHEAL

## 2015-04-24 MED ORDER — METOCLOPRAMIDE HCL 5 MG PO TABS: 5.0000 mg | ORAL_TABLET | Freq: Three times a day (TID) | ORAL | Status: DC | PRN

## 2015-04-24 MED ORDER — POTASSIUM CHLORIDE IN NACL 20-0.9 MEQ/L-% IV SOLN: INTRAVENOUS | Status: DC

## 2015-04-24 SURGICAL SUPPLY — 25 items
BANDAGE ELASTIC 2 VELCRO NS LF (GAUZE/BANDAGES/DRESSINGS) ×2 IMPLANT
BNDG ESMARK 4X12 TAN STRL LF (GAUZE/BANDAGES/DRESSINGS) ×2 IMPLANT
CHLORAPREP W/TINT 26ML (MISCELLANEOUS) ×2 IMPLANT
CORD BIP STRL DISP 12FT (MISCELLANEOUS) ×2 IMPLANT
COVER LIGHT HANDLE UNIVERSAL (MISCELLANEOUS) ×4 IMPLANT
CUFF TOURN SGL QUICK 18 (TOURNIQUET CUFF) ×2 IMPLANT
CUFF TOURNIQUET DUAL PORT 18X3 (MISCELLANEOUS) IMPLANT
DECANTER SPIKE VIAL GLASS SM (MISCELLANEOUS) ×2 IMPLANT
GAUZE PETRO XEROFOAM 1X8 (MISCELLANEOUS) ×2 IMPLANT
GAUZE SPONGE 4X4 12PLY STRL (GAUZE/BANDAGES/DRESSINGS) ×2 IMPLANT
GLOVE BIO SURGEON STRL SZ8 (GLOVE) ×4 IMPLANT
GLOVE INDICATOR 8.0 STRL GRN (GLOVE) ×2 IMPLANT
GOWN STRL REUS W/ TWL LRG LVL3 (GOWN DISPOSABLE) ×1 IMPLANT
GOWN STRL REUS W/ TWL XL LVL3 (GOWN DISPOSABLE) ×1 IMPLANT
GOWN STRL REUS W/TWL LRG LVL3 (GOWN DISPOSABLE) ×1
GOWN STRL REUS W/TWL XL LVL3 (GOWN DISPOSABLE) ×1
KIT ROOM TURNOVER OR (KITS) ×2 IMPLANT
NS IRRIG 500ML POUR BTL (IV SOLUTION) ×2 IMPLANT
PACK EXTREMITY ARMC (MISCELLANEOUS) ×2 IMPLANT
PAD GROUND ADULT SPLIT (MISCELLANEOUS) ×2 IMPLANT
STOCKINETTE 4 (MISCELLANEOUS) ×2 IMPLANT
STRAP BODY AND KNEE 60X3 (MISCELLANEOUS) ×2 IMPLANT
SUT PROLENE 4 0 PS 2 18 (SUTURE) ×2 IMPLANT
SUT VIC AB 3-0 SH 27 (SUTURE)
SUT VIC AB 3-0 SH 27X BRD (SUTURE) IMPLANT

## 2015-04-24 NOTE — Transfer of Care (Signed)
Immediate Anesthesia Transfer of Care Note  Patient: Alicia Long  Procedure(s) Performed: Procedure(s): MINOR RELEASE TRIGGER FINGER/A-1 PULLEY RIGHT THUMB (Right)  Patient Location: PACU  Anesthesia Type: General  Level of Consciousness: awake, alert  and patient cooperative  Airway and Oxygen Therapy: Patient Spontanous Breathing and Patient connected to supplemental oxygen  Post-op Assessment: Post-op Vital signs reviewed, Patient's Cardiovascular Status Stable, Respiratory Function Stable, Patent Airway and No signs of Nausea or vomiting  Post-op Vital Signs: Reviewed and stable  Complications: No apparent anesthesia complications

## 2015-04-24 NOTE — Discharge Instructions (Signed)
General Anesthesia, Adult, Care After Refer to this sheet in the next few weeks. These instructions provide you with information on caring for yourself after your procedure. Your health care provider may also give you more specific instructions. Your treatment has been planned according to current medical practices, but problems sometimes occur. Call your health care provider if you have any problems or questions after your procedure. WHAT TO EXPECT AFTER THE PROCEDURE After the procedure, it is typical to experience:  Sleepiness.  Nausea and vomiting. HOME CARE INSTRUCTIONS  For the first 24 hours after general anesthesia:  Have a responsible person with you.  Do not drive a car. If you are alone, do not take public transportation.  Do not drink alcohol.  Do not take medicine that has not been prescribed by your health care provider.  Do not sign important papers or make important decisions.  You may resume a normal diet and activities as directed by your health care provider.  Change bandages (dressings) as directed.  If you have questions or problems that seem related to general anesthesia, call the hospital and ask for the anesthetist or anesthesiologist on call. SEEK MEDICAL CARE IF:  You have nausea and vomiting that continue the day after anesthesia.  You develop a rash. SEEK IMMEDIATE MEDICAL CARE IF:   You have difficulty breathing.  You have chest pain.  You have any allergic problems.   This information is not intended to replace advice given to you by your health care provider. Make sure you discuss any questions you have with your health care provider.   Document Released: 08/10/2000 Document Revised: 05/25/2014 Document Reviewed: 09/02/2011 Elsevier Interactive Patient Education 2016 [employer].  Keep dressing dry and intact. Keep hand elevated above heart level. May shower after dressing removed on postop day 4 (Sunday). Cover sutures with Band-Aid  after drying off. Apply ice to affected area frequently. Return for follow-up in 10-14 days or as scheduled.

## 2015-04-24 NOTE — Telephone Encounter (Signed)
-----   Message from Nunzio Cobbs, MD sent at 04/23/2015 12:04 PM EST ----- Please contact patient to have her return to do her thyroid lab recheck.   This popped up in my reminder box in EPIC.   Thank you.   Brook ----- Message -----    From: SYSTEM    Sent: 04/22/2015  12:04 AM      To: Nunzio Cobbs, MD

## 2015-04-24 NOTE — Op Note (Signed)
04/24/2015  1:33 PM  Patient:   Alicia Long  Pre-Op Diagnosis:   Right trigger thumb.  Post-Op Diagnosis:   Same.  Procedure:   Release right trigger thumb.  Surgeon:   Pascal Lux, MD  Assistant:   None  Anesthesia:   General LMA  Findings:   As above.  Complications:   None  EBL:   0 cc  Fluids:   600 cc crystalloid  TT:   15 minutes at 250 mmHg  Drains:   None  Closure:   4-0 Prolene  Implants:   None  Brief Clinical Note:   The patient is a 49 year old female with a several month history of progressively worsening painful catching of her right thumb. These symptoms have progressed despite medications, activity modification, etc. The patient's history and examination were consistent with a right trigger thumb. The patient presents at this time for a right trigger thumb release.  Procedure:   The patient was brought into the operating room and lain in the supine position. After adequate general laryngeal mask anesthesia was achieved, the right hand and upper extremity were prepped with ChloraPrep solution before being draped sterilely. Preoperative antibiotics were administered. A timeout was performed to verify the appropriate surgical site before an approximately 1.5-2.0 cm incision was made over the volar aspect of the right thumb at the level of the metacarpal head centered over the flexor sheath. The incision was carried down through the subcutaneous tissues with care taken to identify and protect the digital neurovascular structures. The flexor sheath was entered just proximal to the A1 pulley. The sheath was released proximally for several centimeters under direct visualization. Distally, an attempt was made to pass a clamp beneath the A1 pulley, but it was too tight. Therefore, the A1 pulley was released longitudinally under direct visualization using a #15 blade with care taken to avoid injury to the underlying tendon. Metzenbaum scissors was used to ensure  complete release of the A1 pulley more distally. The underlying tendons were carefully inspected and found to be intact.   The wound was copiously irrigated with sterile saline solution before the wound was closed using 4-0 Prolene interrupted sutures. A total of 10 cc of 0.5% plain Sensorcaine was injected in and around the incision to help with postoperative analgesia before a sterile bulky dressing was applied to the hand. The patient was then awakened and returned to the recovery room in satisfactory condition after tolerating the procedure well.

## 2015-04-24 NOTE — H&P (Signed)
Paper H&P to be scanned into permanent record. H&P reviewed. No changes. 

## 2015-04-24 NOTE — Anesthesia Procedure Notes (Signed)
Procedure Name: LMA Insertion Date/Time: 04/24/2015 1:00 PM Performed by: Londell Moh Pre-anesthesia Checklist: Patient identified, Emergency Drugs available, Suction available, Timeout performed and Patient being monitored Patient Re-evaluated:Patient Re-evaluated prior to inductionOxygen Delivery Method: Circle system utilized Preoxygenation: Pre-oxygenation with 100% oxygen Intubation Type: IV induction LMA: LMA inserted LMA Size: 4.0 Number of attempts: 1 Placement Confirmation: positive ETCO2 and breath sounds checked- equal and bilateral Tube secured with: Tape

## 2015-04-24 NOTE — Anesthesia Postprocedure Evaluation (Signed)
Anesthesia Post Note  Patient: Alicia Long  Procedure(s) Performed: Procedure(s) (LRB): MINOR RELEASE TRIGGER FINGER/A-1 PULLEY RIGHT THUMB (Right)  Patient location during evaluation: PACU Anesthesia Type: General Level of consciousness: awake and alert Pain management: pain level controlled Vital Signs Assessment: post-procedure vital signs reviewed and stable Respiratory status: spontaneous breathing, nonlabored ventilation, respiratory function stable and patient connected to nasal cannula oxygen Cardiovascular status: blood pressure returned to baseline and stable Postop Assessment: no signs of nausea or vomiting Anesthetic complications: no    Marshell Levan

## 2015-04-24 NOTE — Anesthesia Preprocedure Evaluation (Addendum)
Anesthesia Evaluation  Patient identified by MRN, date of birth, ID band  Reviewed: Allergy & Precautions, NPO status , Patient's Chart, lab work & pertinent test results, reviewed documented beta blocker date and time   Airway Mallampati: II  TM Distance: >3 FB Neck ROM: Full    Dental   Pulmonary Current Smoker,           Cardiovascular      Neuro/Psych PSYCHIATRIC DISORDERS Depression Bipolar Disorder    GI/Hepatic   Endo/Other    Renal/GU      Musculoskeletal   Abdominal   Peds  Hematology   Anesthesia Other Findings   Reproductive/Obstetrics                           Anesthesia Physical Anesthesia Plan  ASA: II  Anesthesia Plan: General   Post-op Pain Management:    Induction: Intravenous  Airway Management Planned: LMA  Additional Equipment:   Intra-op Plan:   Post-operative Plan:   Informed Consent: I have reviewed the patients History and Physical, chart, labs and discussed the procedure including the risks, benefits and alternatives for the proposed anesthesia with the patient or authorized representative who has indicated his/her understanding and acceptance.     Plan Discussed with: CRNA  Anesthesia Plan Comments:         Anesthesia Quick Evaluation

## 2015-04-24 NOTE — Telephone Encounter (Signed)
Left message to call Kaitlyn at [phone]. 

## 2015-04-25 ENCOUNTER — Encounter: Payer: Self-pay | Admitting: Surgery

## 2015-04-25 DIAGNOSIS — M65311 Trigger thumb, right thumb: Secondary | ICD-10-CM | POA: Insufficient documentation

## 2015-05-02 NOTE — Telephone Encounter (Signed)
Spoke with patient. Patient states she is being followed by another physician for her esophagus and thyroid. "He is going to check the ph of my esophagus and run some additional testing. Then I will having imaging of my thyroid. I would like to have my labs done with them so that he can look at everything all together." Advised I will let Dr.Silva know and to return call if she needs any assistance.  Routing to provider for final review. Patient agreeable to disposition. Will close encounter.

## 2015-05-07 ENCOUNTER — Ambulatory Visit
Admission: RE | Admit: 2015-05-07 | Discharge: 2015-05-07 | Disposition: A | Discharge status: Home / Self Care | Payer: Managed Care, Other (non HMO) | Source: Ambulatory Visit | Attending: Gastroenterology | Admitting: Gastroenterology

## 2015-05-07 DIAGNOSIS — R1310 Dysphagia, unspecified: Secondary | ICD-10-CM | POA: Diagnosis not present

## 2015-05-07 DIAGNOSIS — Z9049 Acquired absence of other specified parts of digestive tract: Secondary | ICD-10-CM | POA: Diagnosis not present

## 2015-05-07 DIAGNOSIS — D3501 Benign neoplasm of right adrenal gland: Secondary | ICD-10-CM | POA: Insufficient documentation

## 2015-05-07 DIAGNOSIS — K311 Adult hypertrophic pyloric stenosis: Secondary | ICD-10-CM

## 2015-05-07 MED ORDER — IOHEXOL 300 MG/ML  SOLN
75.0000 mL | Freq: Once | INTRAMUSCULAR | Status: AC | PRN
  Administered 2015-05-07: 75 mL via INTRAVENOUS

## 2015-05-09 ENCOUNTER — Telehealth: Payer: Self-pay | Admitting: Gastroenterology

## 2015-05-09 NOTE — Telephone Encounter (Signed)
Where is she as far as her testing is going? Are all her results going back to her referring doctor, Dr. Pryor Ochoa?

## 2015-05-09 NOTE — Telephone Encounter (Signed)
Returned phone call to patient at this time. Explained that CT is negative for mass as Dr. Allen Norris had stated in his notes. Also explained that Dr. Pryor Ochoa would be able to see all results as well.

## 2015-05-15 NOTE — Progress Notes (Signed)
Pt has been notified of her CT Scan results by [employer] today.

## 2015-05-27 ENCOUNTER — Other Ambulatory Visit: Payer: Self-pay

## 2015-05-27 DIAGNOSIS — Z1231 Encounter for screening mammogram for malignant neoplasm of breast: Secondary | ICD-10-CM

## 2015-05-30 ENCOUNTER — Telehealth: Payer: Self-pay

## 2015-05-30 DIAGNOSIS — R7989 Other specified abnormal findings of blood chemistry: Secondary | ICD-10-CM

## 2015-05-30 NOTE — Telephone Encounter (Signed)
Spoke with patient. Advised of message as seen below form Dr.Silva. Patient would like to schedule this lab appointment at this time. Also states she did not have her TSH rechecked with her other provider. Advised she can have her TSH and Vitamin D rechecked at the same time here in the office. Lab appointment scheduled for tomorrow 05/31/2015 at 8:40 am. Order for TSH placed.  Routing to provider for final review. Patient agreeable to disposition. Will close encounter.

## 2015-05-30 NOTE — Telephone Encounter (Signed)
-----   Message from Nunzio Cobbs, MD sent at 05/30/2015  3:20 AM EST ----- Regarding: due for lab visit Please contact patient and remind her she is due for a lab visit for a vit D recheck.  She came up in my reminder box.  Let me know if she has already done this with her PCP or other provider.   Thanks,   Brook ----- Message -----    From: SYSTEM    Sent: 05/30/2015  12:05 AM      To: Nunzio Cobbs, MD

## 2015-05-31 ENCOUNTER — Other Ambulatory Visit (INDEPENDENT_AMBULATORY_CARE_PROVIDER_SITE_OTHER): Payer: Managed Care, Other (non HMO)

## 2015-05-31 ENCOUNTER — Other Ambulatory Visit: Payer: Self-pay

## 2015-05-31 DIAGNOSIS — E559 Vitamin D deficiency, unspecified: Secondary | ICD-10-CM

## 2015-05-31 DIAGNOSIS — R7989 Other specified abnormal findings of blood chemistry: Secondary | ICD-10-CM

## 2015-05-31 NOTE — Addendum Note (Signed)
Addended by: Charmayne Sheer on: 05/31/2015 09:12 AM   Modules accepted: Orders

## 2015-06-01 LAB — VITAMIN D 25 HYDROXY (VIT D DEFICIENCY, FRACTURES): VIT D 25 HYDROXY: 26 ng/mL — AB (ref 30–100)

## 2015-06-01 LAB — TSH: TSH: 1.796 u[IU]/mL (ref 0.350–4.500)

## 2015-06-17 ENCOUNTER — Ambulatory Visit: Payer: Managed Care, Other (non HMO)

## 2015-07-05 ENCOUNTER — Encounter: Payer: Self-pay | Admitting: Obstetrics and Gynecology

## 2015-11-25 IMAGING — CR DG CHEST 2V
2 series · 2 of 2 positions shown · non-contrast
Comparison: 08/10/2012

CLINICAL DATA: Recurrent cough over past 3 months.

EXAM:
CHEST  2 VIEW

[chest pa]
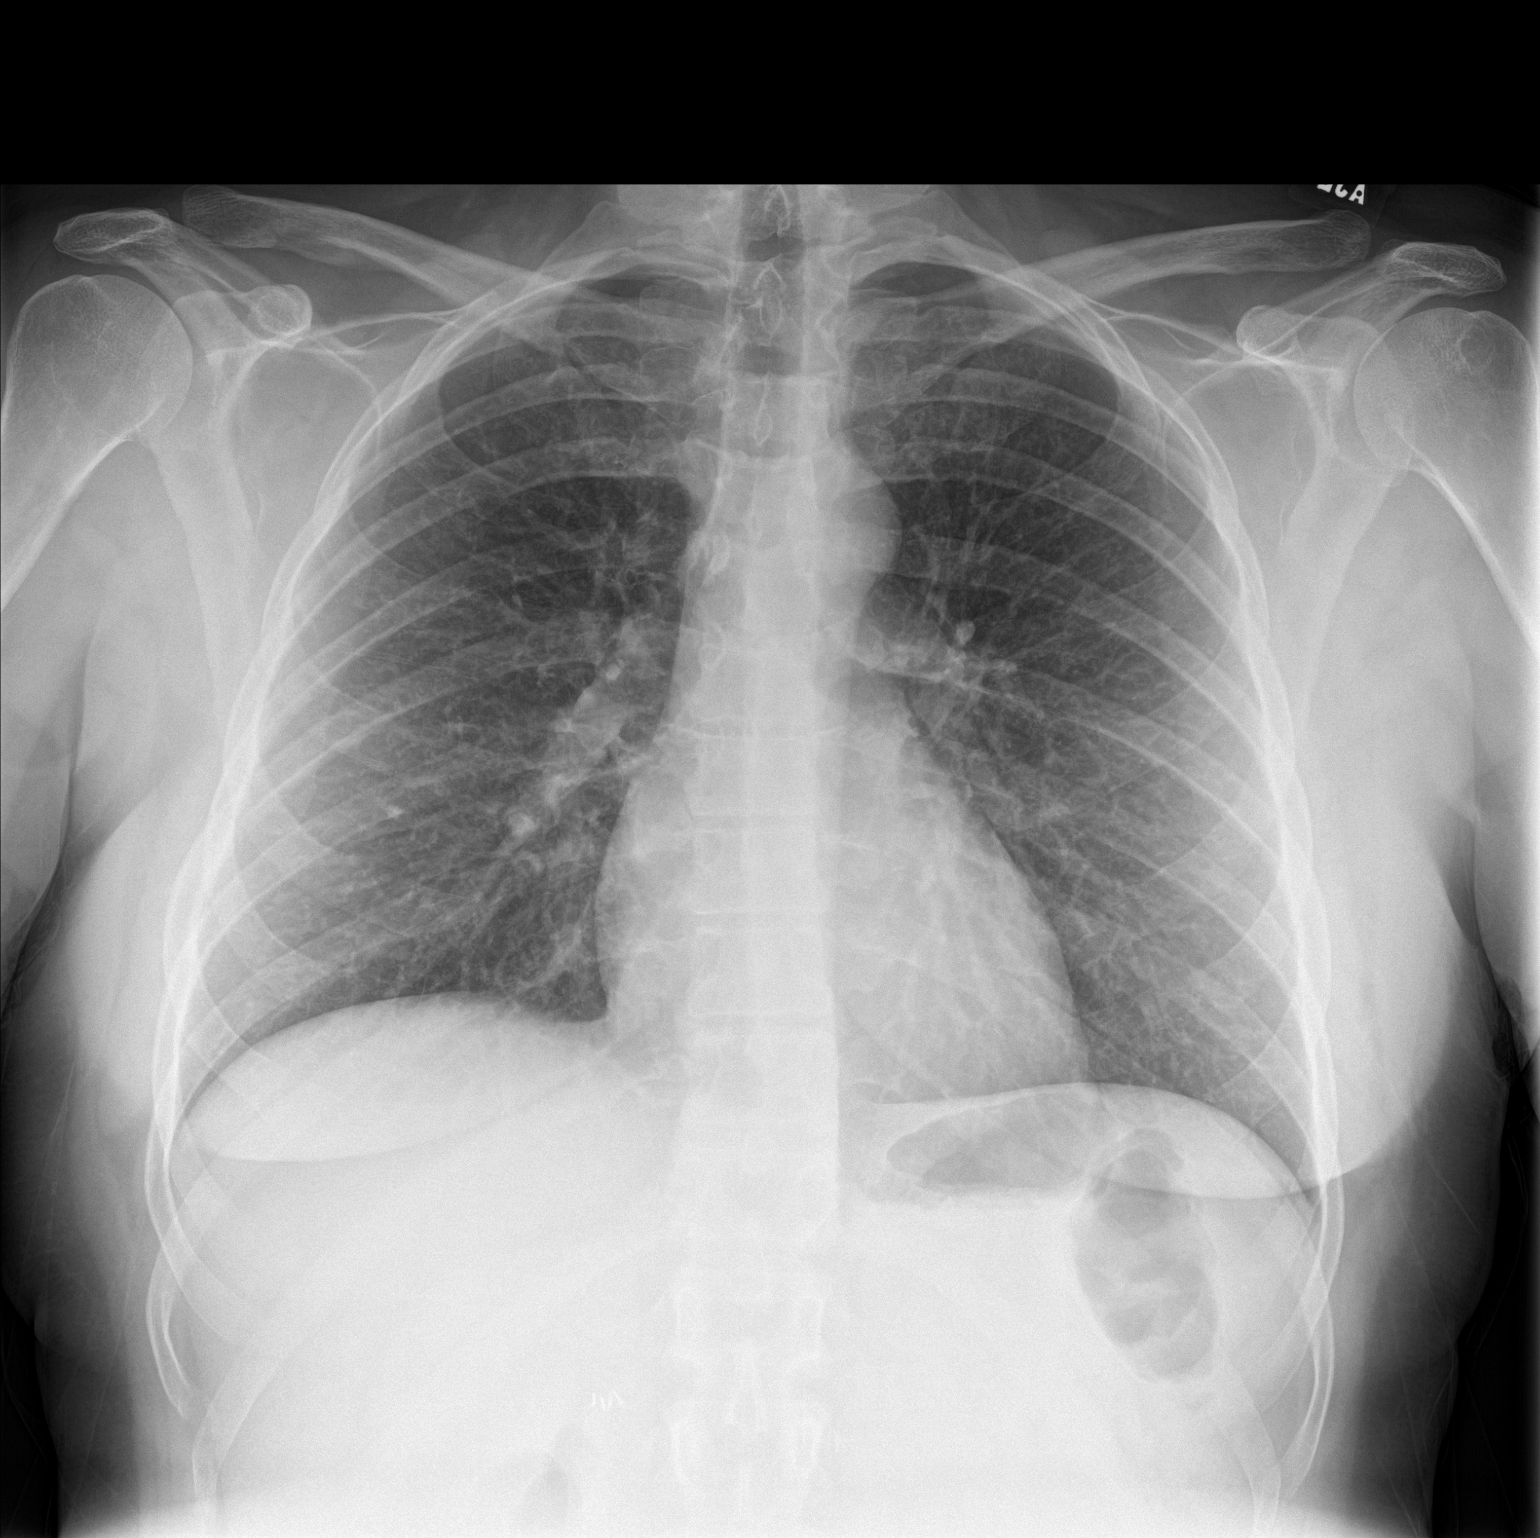

[chest lat]
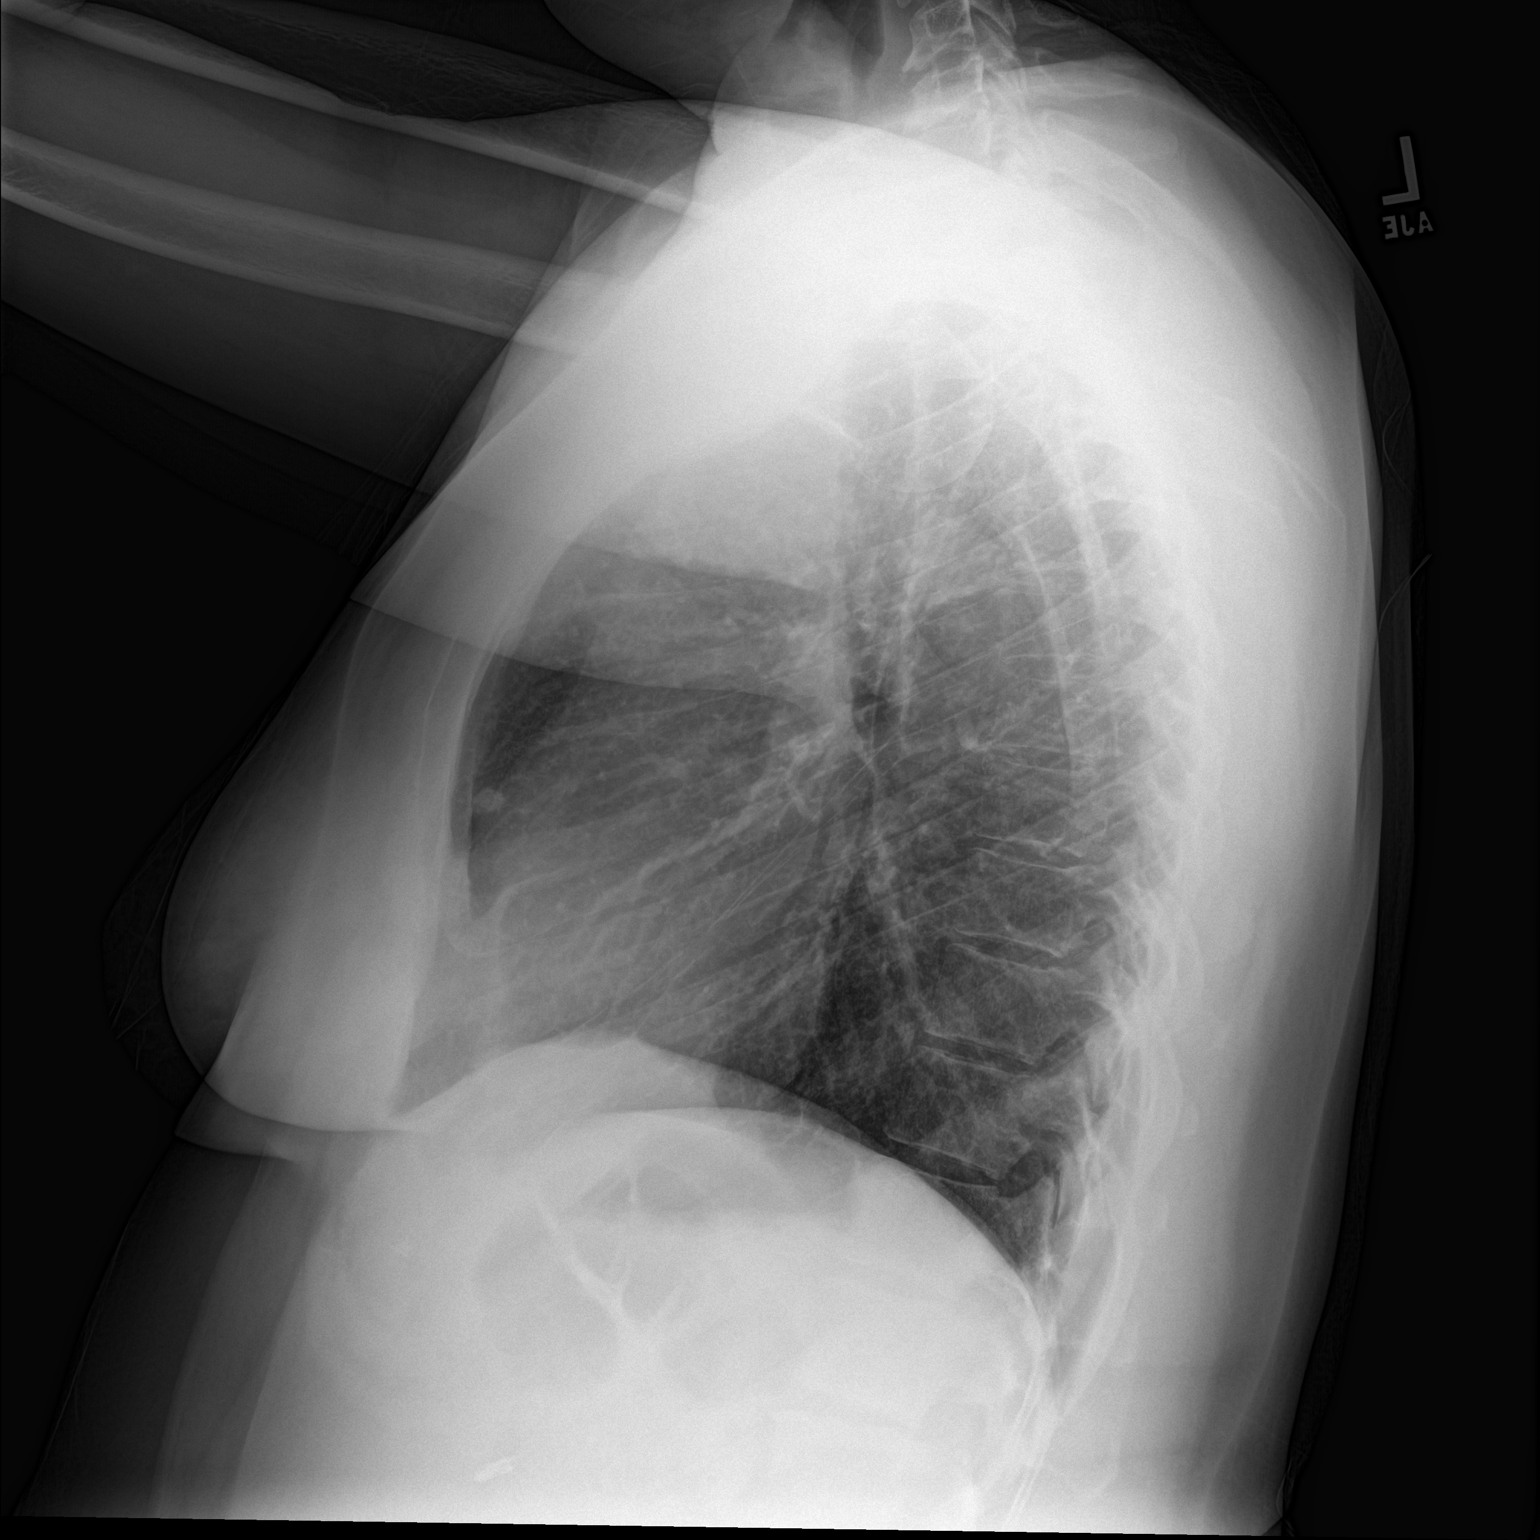

[2 of 2 positions shown; findings below may reference images not displayed]

FINDINGS: The heart size and mediastinal contours are within normal limits.
Both lungs are clear. Right middle lobe calcified granuloma again
noted. No evidence of pleural effusion or pneumothorax. The
visualized skeletal structures are unremarkable.
IMPRESSION: No active cardiopulmonary disease.

## 2015-12-07 ENCOUNTER — Other Ambulatory Visit: Payer: Self-pay | Admitting: Obstetrics and Gynecology

## 2015-12-09 NOTE — Telephone Encounter (Signed)
Medication refill request: Gabapentin Last AEX:  12/17/14 BS Next AEX: 12/19/15 BS Last MMG (if hormonal medication request): 06/07/14 [email or web address] Refill authorized: 12/17/14 #[id number]. Please advise. Thank you.   Medication refill request: Amitriptyline Last AEX:  12/17/14 BS Next AEX: 12/19/15 BS Last MMG (if hormonal medication request): 06/07/14 [email or web address] Refill authorized: 12/17/14 #[id number]. Please advise. Thank you.

## 2015-12-18 ENCOUNTER — Ambulatory Visit (INDEPENDENT_AMBULATORY_CARE_PROVIDER_SITE_OTHER): Payer: Managed Care, Other (non HMO) | Admitting: Obstetrics and Gynecology

## 2015-12-18 ENCOUNTER — Encounter: Payer: Self-pay | Admitting: Obstetrics and Gynecology

## 2015-12-18 VITALS — BP 102/60 | Resp 15 | Ht 66.0 in | Wt 196.0 lb

## 2015-12-18 DIAGNOSIS — R7989 Other specified abnormal findings of blood chemistry: Secondary | ICD-10-CM

## 2015-12-18 DIAGNOSIS — Z0000 Encounter for general adult medical examination without abnormal findings: Secondary | ICD-10-CM

## 2015-12-18 DIAGNOSIS — F17200 Nicotine dependence, unspecified, uncomplicated: Secondary | ICD-10-CM

## 2015-12-18 DIAGNOSIS — R7303 Prediabetes: Secondary | ICD-10-CM | POA: Diagnosis not present

## 2015-12-18 DIAGNOSIS — R946 Abnormal results of thyroid function studies: Secondary | ICD-10-CM

## 2015-12-18 DIAGNOSIS — N644 Mastodynia: Secondary | ICD-10-CM | POA: Diagnosis not present

## 2015-12-18 DIAGNOSIS — Z01419 Encounter for gynecological examination (general) (routine) without abnormal findings: Secondary | ICD-10-CM | POA: Diagnosis not present

## 2015-12-18 DIAGNOSIS — Z720 Tobacco use: Secondary | ICD-10-CM

## 2015-12-18 DIAGNOSIS — Z803 Family history of malignant neoplasm of breast: Secondary | ICD-10-CM | POA: Diagnosis not present

## 2015-12-18 LAB — LIPID PANEL
CHOL/HDL RATIO: 4.7 ratio (ref <=5.0)
CHOLESTEROL: 183 mg/dL (ref 125–200)
HDL: 39 mg/dL — AB (ref >=46)
LDL CALC: 91 mg/dL (ref <130)
TRIGLYCERIDES: 263 mg/dL — AB (ref <150)
VLDL: 53 mg/dL — AB (ref <30)

## 2015-12-18 LAB — COMPREHENSIVE METABOLIC PANEL
ALK PHOS: 61 U/L (ref 33–115)
ALT: 42 U/L — AB (ref 6–29)
AST: 54 U/L — ABNORMAL HIGH (ref 10–35)
Albumin: 3.9 g/dL (ref 3.6–5.1)
BUN: 14 mg/dL (ref 7–25)
CALCIUM: 9.1 mg/dL (ref 8.6–10.2)
CO2: 26 mmol/L (ref 20–31)
Chloride: 104 mmol/L (ref 98–110)
Creat: 0.92 mg/dL (ref 0.50–1.10)
GLUCOSE: 93 mg/dL (ref 65–99)
POTASSIUM: 4 mmol/L (ref 3.5–5.3)
Sodium: 140 mmol/L (ref 135–146)
Total Bilirubin: 0.3 mg/dL (ref 0.2–1.2)
Total Protein: 6.8 g/dL (ref 6.1–8.1)

## 2015-12-18 LAB — CBC
HEMATOCRIT: 43 % (ref 35.0–45.0)
Hemoglobin: 14.2 g/dL (ref 11.7–15.5)
MCH: 31.8 pg (ref 27.0–33.0)
MCHC: 33 g/dL (ref 32.0–36.0)
MCV: 96.4 fL (ref 80.0–100.0)
MPV: 11 fL (ref 7.5–12.5)
PLATELETS: 200 10*3/uL (ref 140–400)
RBC: 4.46 MIL/uL (ref 3.80–5.10)
RDW: 13.5 % (ref 11.0–15.0)
WBC: 10.9 10*3/uL — AB (ref 3.8–10.8)

## 2015-12-18 MED ORDER — GABAPENTIN 300 MG PO CAPS: 300.0000 mg | ORAL_CAPSULE | Freq: Three times a day (TID) | ORAL | 3 refills | Status: DC

## 2015-12-18 NOTE — Patient Instructions (Signed)
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
## 2015-12-19 ENCOUNTER — Ambulatory Visit: Payer: Managed Care, Other (non HMO) | Admitting: Obstetrics and Gynecology

## 2015-12-19 LAB — HEMOGLOBIN A1C
Hgb A1c MFr Bld: 5.6 % (ref <5.7)
Mean Plasma Glucose: 114 mg/dL

## 2015-12-19 LAB — THYROID PANEL WITH TSH
FREE THYROXINE INDEX: 1.9 (ref 1.4–3.8)
T3 Uptake: 28 % (ref 22–35)
T4 TOTAL: 6.7 ug/dL (ref 4.5–12.0)
TSH: 1.38 m[IU]/L

## 2015-12-19 LAB — VITAMIN D 25 HYDROXY (VIT D DEFICIENCY, FRACTURES): Vit D, 25-Hydroxy: 42 ng/mL (ref 30–100)

## 2015-12-20 ENCOUNTER — Telehealth: Payer: Self-pay

## 2015-12-20 DIAGNOSIS — R7989 Other specified abnormal findings of blood chemistry: Secondary | ICD-10-CM

## 2015-12-20 DIAGNOSIS — R945 Abnormal results of liver function studies: Principal | ICD-10-CM

## 2015-12-20 DIAGNOSIS — E782 Mixed hyperlipidemia: Secondary | ICD-10-CM

## 2015-12-20 NOTE — Telephone Encounter (Signed)
-----   Message from Salvadore Dom, MD sent at 12/19/2015  3:59 PM EDT ----- Please let the patient know that her LFT's were mildly elevated, her triglycerides were high (this could be from not fasting), her HDL was low (good cholesterol). The rest of her blood work was normal. She should have a repeat Fasting lipid profile and liver panel next month. Please set this up and send a copy of her labs to her primary.

## 2015-12-20 NOTE — Telephone Encounter (Signed)
Spoke with patient. Advised of message and results as seen below from [place]. She is agreeable and verbalizes understanding. Patient would like to return call on Monday to schedule her 1 month lab recheck appointment. Future order placed for labs.  Routing to provider for final review. Patient agreeable to disposition. Will close encounter.

## 2016-03-15 IMAGING — CT CT CHEST W/ CM
2 of 3 series · 16 of 46 positions shown, 18 images · IV contrast (omnipaque)
Comparison: 05/21/2010

CLINICAL DATA: Dysphagia, history of gastric reflux, family history
of esophageal cancer

EXAM:
CT CHEST WITH CONTRAST
TECHNIQUE: Multidetector CT imaging of the chest was performed during
intravenous contrast administration.
CONTRAST:  75mL OMNIPAQUE IOHEXOL 300 MG/ML  SOLN

[Series 2: routine chest with · axial · 0.68mm/px · z∈[+450,+716]mm · 13 of 61 slices shown, 15 images]
[im 4/61  soft-tissue]
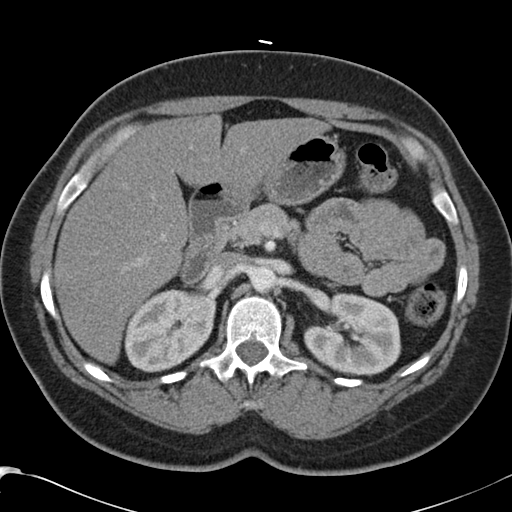
[im 4/61  bone]
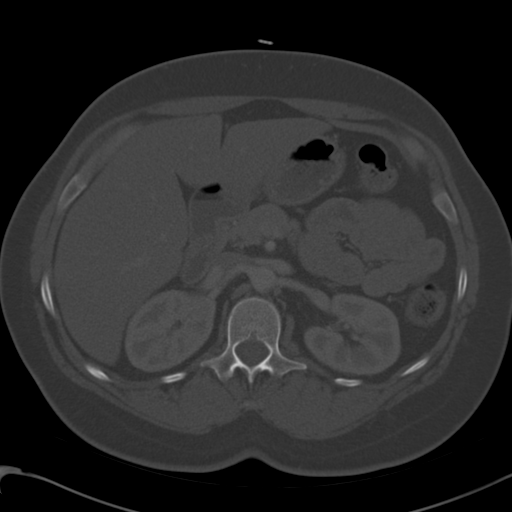
[im 8/61  soft-tissue]
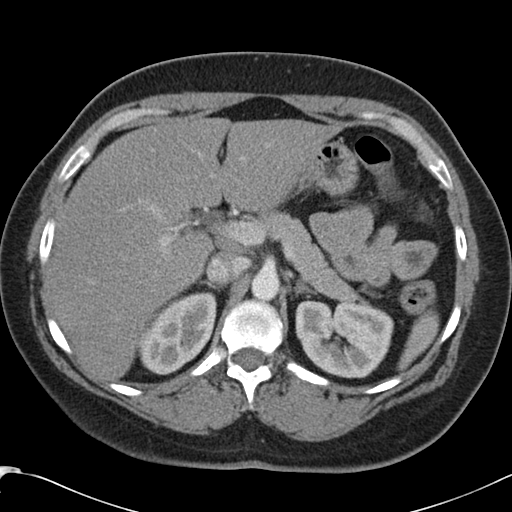
[im 12/61  soft-tissue]
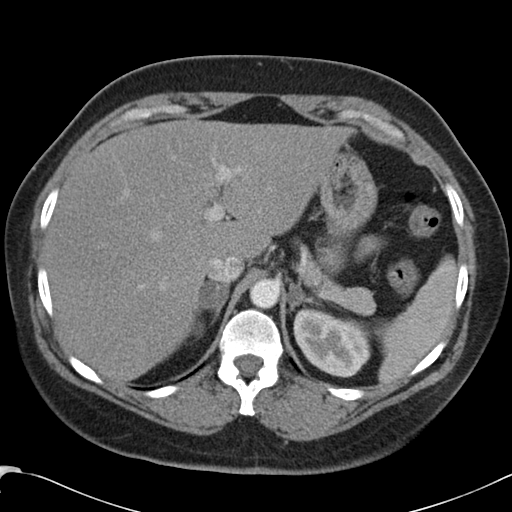
[im 18/61  soft-tissue]
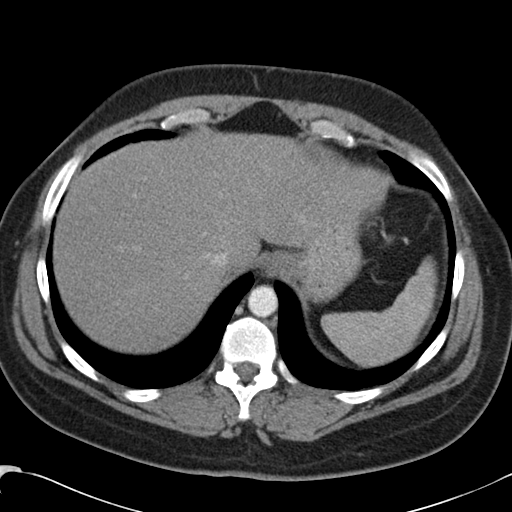
[im 22/61  soft-tissue]
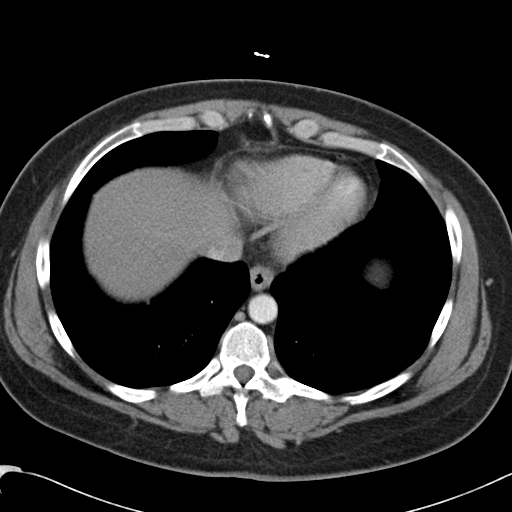
[im 26/61  soft-tissue]
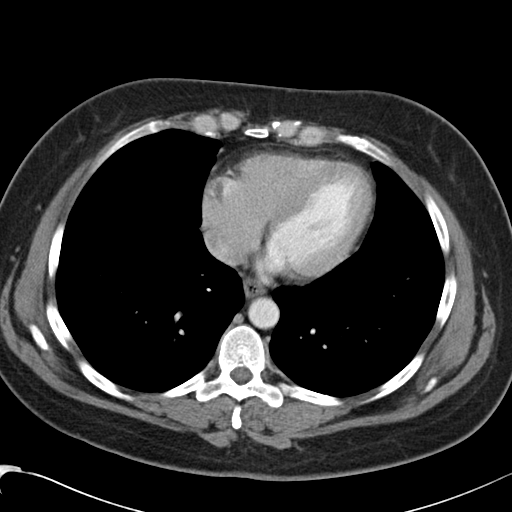
[im 31/61  soft-tissue]
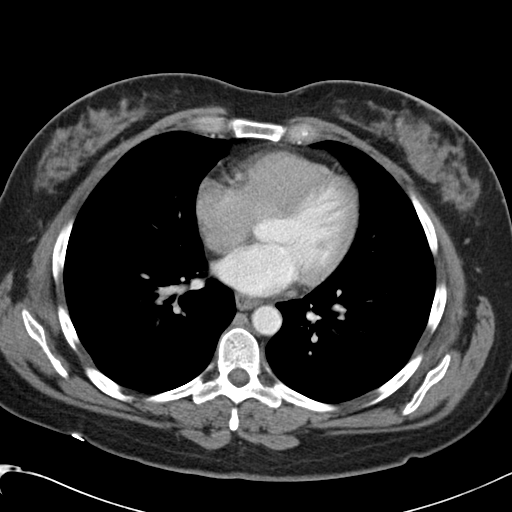
[im 35/61  soft-tissue]
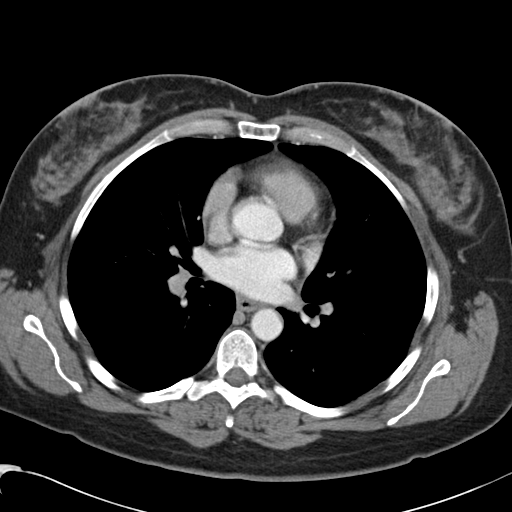
[im 39/61  soft-tissue]
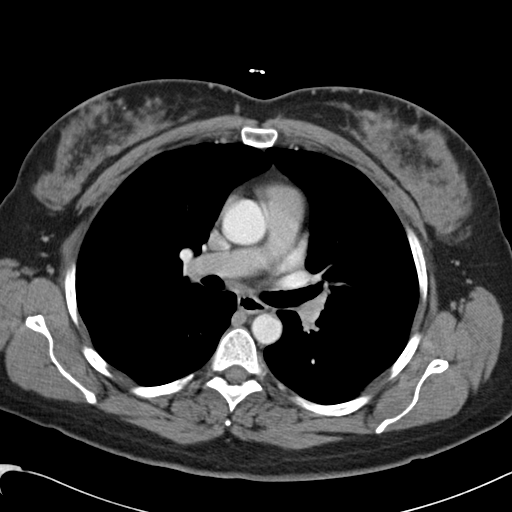
[im 39/61  bone]
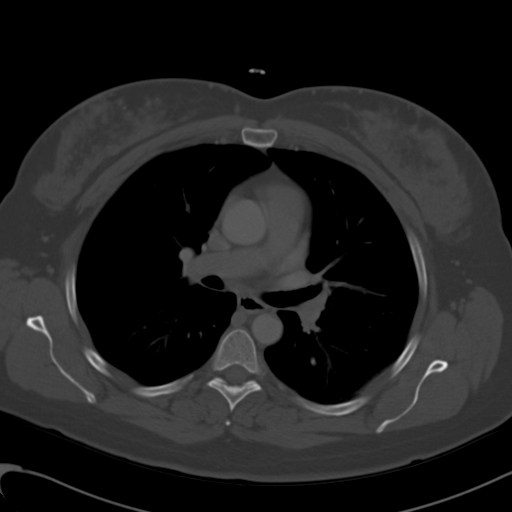
[im 43/61  soft-tissue]
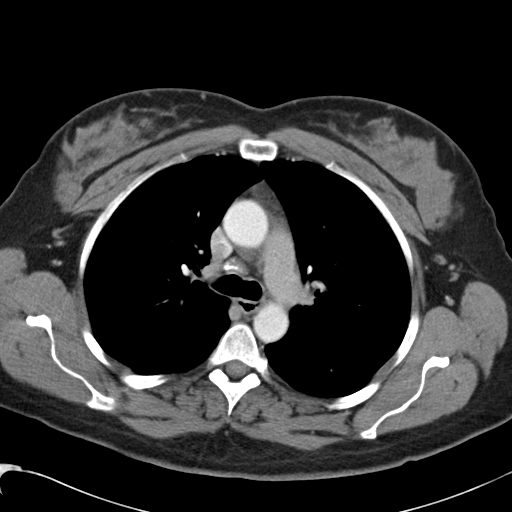
[im 49/61  soft-tissue]
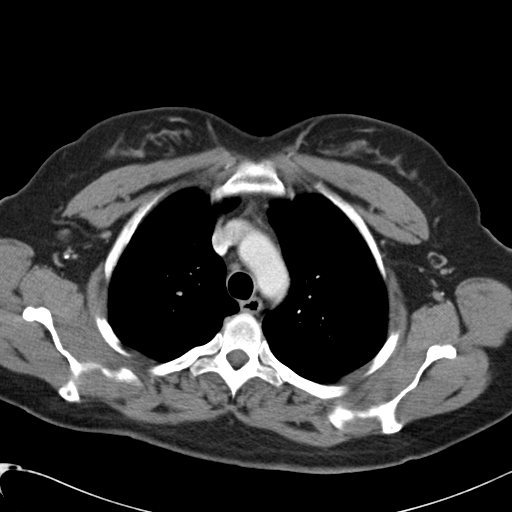
[im 53/61  soft-tissue]
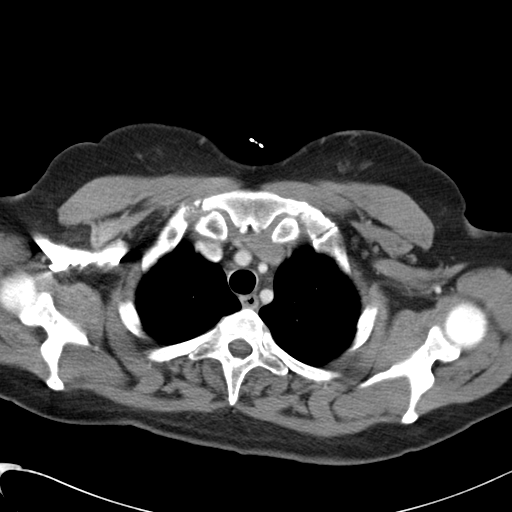
[im 57/61  soft-tissue]
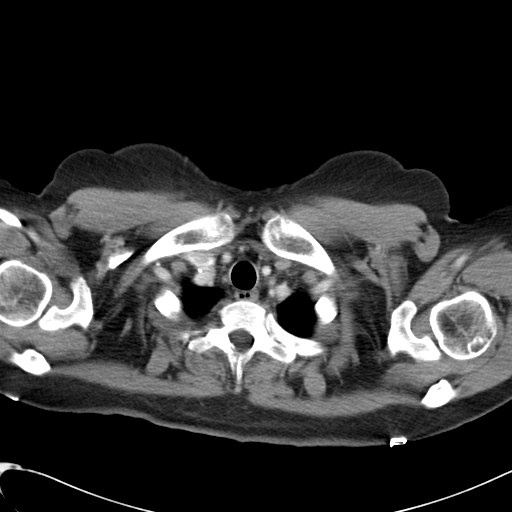

[Series 5: routine chest with cor · coronal · 0.62mm/px · 3 of 145 slices shown]
[im 49/145  soft-tissue]
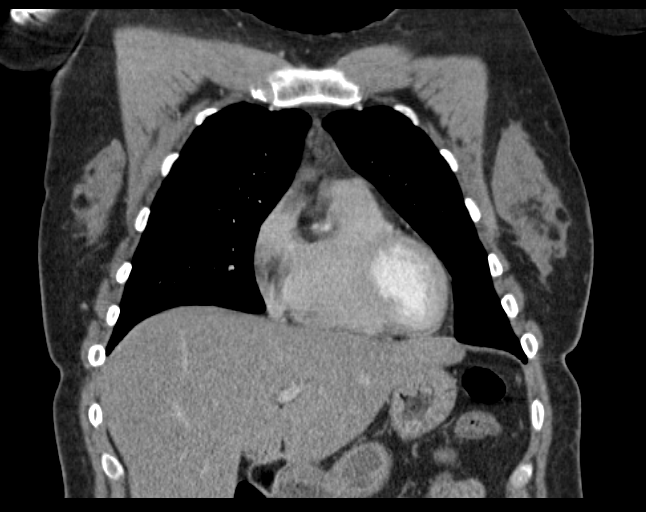
[im 65/145  soft-tissue]
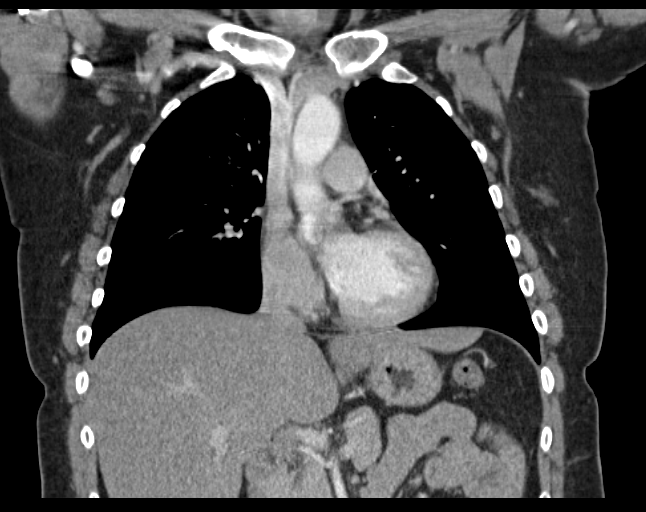
[im 81/145  soft-tissue]
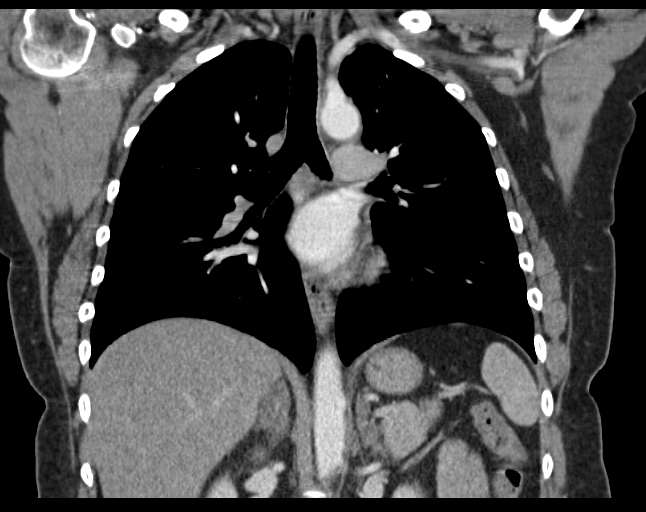

[16 of 46 positions shown; findings below may reference images not displayed]

FINDINGS: Sagittal images of the spine are unremarkable. Sagittal view of the
sternum is unremarkable. There is mild fatty infiltration of the
liver. Postcholecystectomy surgical clips are noted. Right adrenal
adenoma again noted stable in size measures 1.8 cm.

The central airways are patent. Images of the thoracic inlet are
unremarkable. No destructive rib lesions are noted. A calcified
right pretracheal lymph node measures 8 mm stable in size in
appearance from prior exam. There is a partially calcified
precarinal lymph node measures 7.5 mm short-axis stable in size in
appearance not pathologic by size criteria.

There is no hilar adenopathy. Central pulmonary artery and thoracic
aorta is unremarkable. No esophageal mass. Question tiny hiatal
hernia.

Again noted punctate calcifications within spleen consistent with
prior granulomatous disease.

Images of the lung parenchyma shows no acute infiltrate or pulmonary
edema. A calcified granuloma in right middle lobe measures 7 mm
stable in size in appearance from prior exam.
IMPRESSION: 1. Stable calcified mediastinal lymph nodes consistent with prior
granulomatous disease. Calcified granuloma in right middle lobe is
stable in size in appearance.
2. No acute infiltrate or pulmonary edema.
3. No evidence of esophageal mass.  Question tiny hiatal hernia.
4. Stable right adrenal adenoma measures 1.8 cm. Postcholecystectomy
surgical clips are noted.

## 2016-06-11 ENCOUNTER — Telehealth: Payer: Self-pay | Admitting: *Deleted

## 2016-06-11 NOTE — Telephone Encounter (Signed)
-----   Message from Salvadore Dom, MD sent at 06/04/2016  3:19 PM EST ----- Please contact the patient, she never returned for f/u LFT's or fasting lipid panel. She really should do this.  Thanks, Sharee Pimple ----- Message ----- From: SYSTEM Sent: 06/03/2016  12:06 AM To: Salvadore Dom, MD

## 2016-06-11 NOTE — Telephone Encounter (Signed)
Left message to call regarding message below -eh 

## 2016-06-11 NOTE — Telephone Encounter (Signed)
Spoke with patient and scheduled for labs tomorrow -eh

## 2016-06-12 ENCOUNTER — Encounter: Payer: Self-pay | Admitting: Obstetrics and Gynecology

## 2016-06-12 ENCOUNTER — Other Ambulatory Visit (INDEPENDENT_AMBULATORY_CARE_PROVIDER_SITE_OTHER): Payer: Commercial Managed Care - PPO

## 2016-06-12 DIAGNOSIS — R7989 Other specified abnormal findings of blood chemistry: Secondary | ICD-10-CM

## 2016-06-12 DIAGNOSIS — E782 Mixed hyperlipidemia: Secondary | ICD-10-CM

## 2016-06-12 DIAGNOSIS — R945 Abnormal results of liver function studies: Secondary | ICD-10-CM

## 2016-06-13 LAB — LIPID PANEL
CHOLESTEROL: 199 mg/dL (ref <200)
HDL: 35 mg/dL — ABNORMAL LOW (ref >50)
LDL Cholesterol: 137 mg/dL — ABNORMAL HIGH (ref <100)
TRIGLYCERIDES: 134 mg/dL (ref <150)
Total CHOL/HDL Ratio: 5.7 Ratio — ABNORMAL HIGH (ref <5.0)
VLDL: 27 mg/dL (ref <30)

## 2016-06-13 LAB — HEPATIC FUNCTION PANEL
ALK PHOS: 56 U/L (ref 33–115)
ALT: 6 U/L (ref 6–29)
AST: 11 U/L (ref 10–35)
Albumin: 4 g/dL (ref 3.6–5.1)
BILIRUBIN DIRECT: 0.1 mg/dL (ref <=0.2)
BILIRUBIN INDIRECT: 0.3 mg/dL (ref 0.2–1.2)
TOTAL PROTEIN: 6.6 g/dL (ref 6.1–8.1)
Total Bilirubin: 0.4 mg/dL (ref 0.2–1.2)

## 2016-09-14 ENCOUNTER — Emergency Department: Payer: Commercial Managed Care - PPO

## 2016-09-14 ENCOUNTER — Observation Stay
Admission: EM | Admit: 2016-09-14 | Discharge: 2016-09-15 | Disposition: A | Discharge status: Home / Self Care | Payer: Commercial Managed Care - PPO | Attending: Internal Medicine | Admitting: Internal Medicine

## 2016-09-14 DIAGNOSIS — F17210 Nicotine dependence, cigarettes, uncomplicated: Secondary | ICD-10-CM | POA: Insufficient documentation

## 2016-09-14 DIAGNOSIS — R200 Anesthesia of skin: Secondary | ICD-10-CM | POA: Diagnosis not present

## 2016-09-14 DIAGNOSIS — F319 Bipolar disorder, unspecified: Secondary | ICD-10-CM | POA: Insufficient documentation

## 2016-09-14 DIAGNOSIS — M2578 Osteophyte, vertebrae: Secondary | ICD-10-CM | POA: Diagnosis not present

## 2016-09-14 DIAGNOSIS — R202 Paresthesia of skin: Secondary | ICD-10-CM

## 2016-09-14 DIAGNOSIS — R55 Syncope and collapse: Secondary | ICD-10-CM | POA: Diagnosis not present

## 2016-09-14 DIAGNOSIS — Z79899 Other long term (current) drug therapy: Secondary | ICD-10-CM | POA: Insufficient documentation

## 2016-09-14 LAB — URINE DRUG SCREEN, QUALITATIVE (ARMC ONLY)
Amphetamines, Ur Screen: NOT DETECTED
BARBITURATES, UR SCREEN: NOT DETECTED
BENZODIAZEPINE, UR SCRN: POSITIVE — AB
Cannabinoid 50 Ng, Ur ~~LOC~~: NOT DETECTED
Cocaine Metabolite,Ur ~~LOC~~: POSITIVE — AB
MDMA (Ecstasy)Ur Screen: NOT DETECTED
Methadone Scn, Ur: NOT DETECTED
Opiate, Ur Screen: NOT DETECTED
PHENCYCLIDINE (PCP) UR S: NOT DETECTED
TRICYCLIC, UR SCREEN: POSITIVE — AB

## 2016-09-14 LAB — URINALYSIS, COMPLETE (UACMP) WITH MICROSCOPIC
Bacteria, UA: NONE SEEN
Bilirubin Urine: NEGATIVE
GLUCOSE, UA: NEGATIVE mg/dL
Hgb urine dipstick: NEGATIVE
KETONES UR: 5 mg/dL — AB
Leukocytes, UA: NEGATIVE
Nitrite: NEGATIVE
PROTEIN: NEGATIVE mg/dL
Specific Gravity, Urine: 1.011 (ref 1.005–1.030)
pH: 7 (ref 5.0–8.0)

## 2016-09-14 LAB — CBC
HCT: 39.9 % (ref 35.0–47.0)
HEMOGLOBIN: 13.7 g/dL (ref 12.0–16.0)
MCH: 31.9 pg (ref 26.0–34.0)
MCHC: 34.3 g/dL (ref 32.0–36.0)
MCV: 93 fL (ref 80.0–100.0)
Platelets: 246 10*3/uL (ref 150–440)
RBC: 4.29 MIL/uL (ref 3.80–5.20)
RDW: 13.6 % (ref 11.5–14.5)
WBC: 11.8 10*3/uL — ABNORMAL HIGH (ref 3.6–11.0)

## 2016-09-14 LAB — BASIC METABOLIC PANEL
Anion gap: 10 (ref 5–15)
BUN: 14 mg/dL (ref 6–20)
CALCIUM: 9.2 mg/dL (ref 8.9–10.3)
CO2: 27 mmol/L (ref 22–32)
Chloride: 102 mmol/L (ref 101–111)
Creatinine, Ser: 0.8 mg/dL (ref 0.44–1.00)
GFR calc non Af Amer: >60 mL/min (ref >60)
Glucose, Bld: 96 mg/dL (ref 65–99)
POTASSIUM: 3.8 mmol/L (ref 3.5–5.1)
SODIUM: 139 mmol/L (ref 135–145)

## 2016-09-14 LAB — ETHANOL

## 2016-09-14 LAB — CARBAMAZEPINE LEVEL, TOTAL

## 2016-09-14 LAB — VALPROIC ACID LEVEL: VALPROIC ACID LVL: 50 ug/mL (ref 50.0–100.0)

## 2016-09-14 LAB — TROPONIN I

## 2016-09-14 LAB — SALICYLATE LEVEL: Salicylate Lvl: <7 mg/dL (ref 2.8–30.0)

## 2016-09-14 LAB — MAGNESIUM: Magnesium: 2.1 mg/dL (ref 1.7–2.4)

## 2016-09-14 LAB — GLUCOSE, CAPILLARY: GLUCOSE-CAPILLARY: 111 mg/dL — AB (ref 65–99)

## 2016-09-14 LAB — ACETAMINOPHEN LEVEL

## 2016-09-14 MED ORDER — ALPRAZOLAM 1 MG PO TABS: 1.0000 mg | ORAL_TABLET | ORAL | Status: DC | PRN

## 2016-09-14 MED ORDER — SODIUM CHLORIDE 0.9 % IV BOLUS (SEPSIS)
1000.0000 mL | Freq: Once | INTRAVENOUS | Status: AC
  Administered 2016-09-14: 1000 mL via INTRAVENOUS

## 2016-09-14 MED ORDER — ACETAMINOPHEN 325 MG PO TABS: 650.0000 mg | ORAL_TABLET | Freq: Four times a day (QID) | ORAL | Status: DC | PRN

## 2016-09-14 MED ORDER — HYDROCODONE-ACETAMINOPHEN 5-325 MG PO TABS
1.0000 | ORAL_TABLET | Freq: Four times a day (QID) | ORAL | Status: DC | PRN
  Administered 2016-09-14: 1 via ORAL
  Administered 2016-09-15 (×2): 2 via ORAL
  Filled 2016-09-14: qty 1
  Filled 2016-09-14 (×2): qty 2

## 2016-09-14 MED ORDER — GABAPENTIN 300 MG PO CAPS
300.0000 mg | ORAL_CAPSULE | Freq: Three times a day (TID) | ORAL | Status: DC
  Administered 2016-09-14 – 2016-09-15 (×2): 300 mg via ORAL
  Filled 2016-09-14 (×2): qty 1

## 2016-09-14 MED ORDER — DIVALPROEX SODIUM ER 500 MG PO TB24
1000.0000 mg | ORAL_TABLET | Freq: Every day | ORAL | Status: DC
  Administered 2016-09-14: 1000 mg via ORAL
  Filled 2016-09-14 (×2): qty 2

## 2016-09-14 MED ORDER — ONDANSETRON HCL 4 MG/2ML IJ SOLN: 4.0000 mg | Freq: Four times a day (QID) | INTRAMUSCULAR | Status: DC | PRN

## 2016-09-14 MED ORDER — DIVALPROEX SODIUM ER 500 MG PO TB24
1000.0000 mg | ORAL_TABLET | Freq: Every day | ORAL | Status: DC
  Filled 2016-09-14: qty 2

## 2016-09-14 MED ORDER — ACETAMINOPHEN 650 MG RE SUPP: 650.0000 mg | Freq: Four times a day (QID) | RECTAL | Status: DC | PRN

## 2016-09-14 MED ORDER — LAMOTRIGINE 25 MG PO TABS: 150.0000 mg | ORAL_TABLET | Freq: Every day | ORAL | Status: DC

## 2016-09-14 MED ORDER — ONDANSETRON HCL 4 MG PO TABS: 4.0000 mg | ORAL_TABLET | Freq: Four times a day (QID) | ORAL | Status: DC | PRN

## 2016-09-14 MED ORDER — ENOXAPARIN SODIUM 40 MG/0.4ML ~~LOC~~ SOLN
40.0000 mg | SUBCUTANEOUS | Status: DC
  Administered 2016-09-14: 40 mg via SUBCUTANEOUS
  Filled 2016-09-14: qty 0.4

## 2016-09-14 MED ORDER — AMITRIPTYLINE HCL 25 MG PO TABS
25.0000 mg | ORAL_TABLET | Freq: Every day | ORAL | Status: DC
  Administered 2016-09-14: 25 mg via ORAL
  Filled 2016-09-14: qty 1

## 2016-09-14 MED ORDER — LAMOTRIGINE 25 MG PO TABS
150.0000 mg | ORAL_TABLET | Freq: Every day | ORAL | Status: DC
  Administered 2016-09-14: 150 mg via ORAL
  Filled 2016-09-14: qty 1

## 2016-09-14 MED ORDER — DIVALPROEX SODIUM ER 500 MG PO TB24: 1000.0000 mg | ORAL_TABLET | Freq: Every day | ORAL | Status: DC

## 2016-09-14 MED ORDER — SODIUM CHLORIDE 0.9% FLUSH
3.0000 mL | Freq: Two times a day (BID) | INTRAVENOUS | Status: DC
  Administered 2016-09-14 – 2016-09-15 (×2): 3 mL via INTRAVENOUS

## 2016-09-14 MED ORDER — ALBUTEROL SULFATE (2.5 MG/3ML) 0.083% IN NEBU: 2.5000 mg | INHALATION_SOLUTION | RESPIRATORY_TRACT | Status: DC | PRN

## 2016-09-14 NOTE — ED Notes (Signed)
Per report from [place], patient stated she felt fine to walk to the room commode and ambulated to the commode with a steady gait.

## 2016-09-14 NOTE — ED Notes (Signed)
Patient states she hasn't passed out for a while, but feels tired. NAD. Patient given TV remote per request.
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
## 2016-09-14 NOTE — ED Notes (Signed)
Patient has several episodes of brief LOC and doesn't respond to voice. Patient awakes easily to sternal rub and has brief episode of crying and is completely oriented.

## 2016-09-14 NOTE — ED Triage Notes (Signed)
Pt arrived to ED after syncopal episode, son reports pt was found on the ground. Pt reports dizziness, has eyes closed at front desk but alert and able to answer questions.
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
## 2016-09-14 NOTE — ED Triage Notes (Signed)
Pt states she had a syncopal episode this am, "everything keeps going black, then coming back." Denies this happening before. Pt states she has been having bilateral ear pain since Thursday. States she woke up this am with right hand tingling/right arm numbness at 0900, bilateral grips weak and equal. Speech clear, face symmetrical.

## 2016-09-15 ENCOUNTER — Observation Stay: Payer: Commercial Managed Care - PPO

## 2016-09-15 DIAGNOSIS — R55 Syncope and collapse: Secondary | ICD-10-CM | POA: Diagnosis not present

## 2016-09-15 DIAGNOSIS — R202 Paresthesia of skin: Secondary | ICD-10-CM

## 2016-09-15 DIAGNOSIS — R200 Anesthesia of skin: Secondary | ICD-10-CM

## 2016-09-15 LAB — CBC
HEMATOCRIT: 37.9 % (ref 35.0–47.0)
Hemoglobin: 12.6 g/dL (ref 12.0–16.0)
MCH: 31.6 pg (ref 26.0–34.0)
MCHC: 33.2 g/dL (ref 32.0–36.0)
MCV: 95.4 fL (ref 80.0–100.0)
Platelets: 224 10*3/uL (ref 150–440)
RBC: 3.98 MIL/uL (ref 3.80–5.20)
RDW: 13.9 % (ref 11.5–14.5)
WBC: 7.2 10*3/uL (ref 3.6–11.0)

## 2016-09-15 LAB — VITAMIN B12: Vitamin B-12: 360 pg/mL (ref 180–914)

## 2016-09-15 LAB — BASIC METABOLIC PANEL
Anion gap: 5 (ref 5–15)
BUN: 14 mg/dL (ref 6–20)
CHLORIDE: 108 mmol/L (ref 101–111)
CO2: 26 mmol/L (ref 22–32)
Calcium: 8.4 mg/dL — ABNORMAL LOW (ref 8.9–10.3)
Creatinine, Ser: 0.69 mg/dL (ref 0.44–1.00)
GFR calc Af Amer: >60 mL/min (ref >60)
GFR calc non Af Amer: >60 mL/min (ref >60)
Glucose, Bld: 93 mg/dL (ref 65–99)
POTASSIUM: 3.8 mmol/L (ref 3.5–5.1)
SODIUM: 139 mmol/L (ref 135–145)

## 2016-09-15 LAB — TSH: TSH: 3.707 u[IU]/mL (ref 0.350–4.500)

## 2016-09-15 NOTE — Progress Notes (Signed)
Patient discharged with husband. Discharge instructions reviewed and MRI results given by MD. No new medications ordered. IV removed without complications or reports of pain. Patient left floor in w/c with volunteer assistance.

## 2016-09-15 NOTE — Progress Notes (Signed)
[name] at [place] was admitted to the Hospital on 09/14/2016 and Discharged  09/15/2016 and should be excused from work/school   for 2  days starting 09/14/2016 , may return to work/school without any restrictions.  Call Bettey Costa MD with questions.  [name] M.D on 09/15/2016,at 12:41 PM  [place] at [facility]  [phone]

## 2016-09-15 NOTE — Care Management (Signed)
Placed in observation for syncope.  Neurology is consulting.  Positive drug screen. Independent in all adls, denies issues accessing medical care, obtaining medications or with transportation.  No discharge needs identified at present by care manager or members of care team
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
## 2016-09-15 NOTE — Evaluation (Signed)
Occupational Therapy Evaluation Patient Details Name: Alicia Long MRN: [id number] DOB: February 18, 1966 Today's Date: 09/15/2016    History of Present Illness Alicia Long is an 51 y.o. female with a history of sinus disease who reports that she began to get ear pain towards the end of last week that has progressively worsened.  She was in the yard over the weekend working and did not drink a lot of water.  On yesterday was in the house and had the onset of dizziness (feeling as if she was going to pass out) she then passed out falling backward.  Patient also reports tingling in her hands that is fairly consistent on the right but intermittently on the left as well.  Was brought to the ED where she had multiple episodes of passing out while lying down.  She reports dizziness at times with standing.     Clinical Impression   Patient seen for OT evaluation this date.  She lives at home with her husband and her adult son in a 2 story home.  She was previously independent with all ADLs and IADLs including driving and working full time.  She has had several syncopal episodes and is scheduled for a cervical MRI today along with other tests to find the source of her syncope.  She presents with slight muscle weakness, dizziness, decreased balance and decreased ability to complete self care tasks this date.  She would benefit from skilled OT to maximize her safety and independence in self care tasks.    Follow Up Recommendations  No OT follow up    Equipment Recommendations       Recommendations for Other Services       Precautions / Restrictions Precautions Precautions: Fall Precaution Comments: Patient complains of dizziness at times with standing and will require assistance to and from the bathroom secondary to syncopal episodes.  Restrictions Weight Bearing Restrictions: No      Mobility Bed Mobility Overal bed mobility: Modified Independent                Transfers Overall transfer  level: Needs assistance   Transfers: Sit to/from Stand Sit to Stand: Min guard         General transfer comment: REquires min guard to and from the bathroom for safety    Balance                                           ADL either performed or assessed with clinical judgement   ADL   Eating/Feeding: Independent   Grooming: Min guard Grooming Details (indicate cue type and reason): min guard for safety in standing at the sink due to syncope Upper Body Bathing: Set up   Lower Body Bathing: Minimal assistance Lower Body Bathing Details (indicate cue type and reason): Required min assist with lower body bathing and dressing today Upper Body Dressing : Independent   Lower Body Dressing: Minimal assistance   Toilet Transfer: Min guard   Toileting- Clothing Manipulation and Hygiene: Min guard       Functional mobility during ADLs: Min guard       Vision         Perception     Praxis      Pertinent Vitals/Pain Pain Assessment: No/denies pain     Hand Dominance Right   Extremity/Trunk Assessment Upper Extremity Assessment Upper Extremity Assessment: Overall [facility]  for tasks assessed   Lower Extremity Assessment Lower Extremity Assessment: Overall WFL for tasks assessed       Communication Communication Communication: No difficulties   Cognition Arousal/Alertness: Awake/alert Behavior During Therapy: WFL for tasks assessed/performed Overall Cognitive Status: Within Functional Limits for tasks assessed                                     General Comments  Patient reports recent issues with balance and had to change her method of lower body dressing to a seated position in recent months.     Exercises     Shoulder Instructions      Home Living Family/patient expects to be discharged to:: Private residence Living Arrangements: Spouse/significant other Available Help at Discharge: Family Type of Home: House Home  Access: Stairs to enter     Home Layout: Two level Alternate Level Stairs-Number of Steps: Patient reports her bedroom is on the first level however she goes upstairs frequently to smoke.  Alternate Level Stairs-Rails: Right Bathroom Shower/Tub: Tub/shower unit;Curtain   Bathroom Toilet: Standard Bathroom Accessibility: Yes   Home Equipment: None          Prior Functioning/Environment Level of Independence: Independent        Comments: Patient was previously independent in all aspect of care including driving and working full time as a Dietitian, reports she is on her feet alot and has increased stress at work.  She notes some mild dizziness when bending over towards feet.  She reports some previous changes with her balance to where she had to modify lower body dressing to a seated position in the last few months.         OT Problem List: Decreased strength;Impaired balance (sitting and/or standing);Decreased knowledge of use of DME or AE      OT Treatment/Interventions: Self-care/ADL training;DME and/or AE instruction;Therapeutic activities;Balance training;Therapeutic exercise;Patient/family education    OT Goals(Current goals can be found in the care plan section) Acute Rehab OT Goals Patient Stated Goal: Patient reports she would like to return home with her husband and be able to go back to work  OT Goal Formulation: With patient Time For Goal Achievement: 09/26/16 Potential to Achieve Goals: Good  OT Frequency: Min 1X/week   Barriers to D/C:            Co-evaluation              AM-PAC PT "6 Clicks" Daily Activity     Outcome Measure Help from another person eating meals?: None Help from another person taking care of personal grooming?: A Little Help from another person toileting, which includes using toliet, bedpan, or urinal?: A Little Help from another person bathing (including washing, rinsing, drying)?: A Little Help from another  person to put on and taking off regular upper body clothing?: None Help from another person to put on and taking off regular lower body clothing?: A Little 6 Click Score: 20   End of Session Equipment Utilized During Treatment: Gait belt  Activity Tolerance: Patient tolerated treatment well Patient left: in bed;with call bell/phone within reach;with family/visitor present;with bed alarm set  OT Visit Diagnosis: Unsteadiness on feet (R26.81);Muscle weakness (generalized) (M62.81)                Time: 1035-1100 OT Time Calculation (min): 25 min Charges:  OT General Charges $OT Visit: 1 Procedure OT Evaluation $OT Eval  Low Complexity: 1 Procedure OT Treatments $Self Care/Home Management : 8-22 mins G-Codes:     Vaida Kerchner T Terius Jacuinde, OTR/L, CLT   Mertice Uffelman 09/15/2016, 11:42 AM

## 2016-09-15 NOTE — Discharge Summary (Signed)
[facility] NAME: Alicia Long    MR#:  [id number]  DATE OF BIRTH:  1966-02-26  DATE OF ADMISSION:  09/14/2016 ADMITTING PHYSICIAN: Demetrios Loll, MD  DATE OF DISCHARGE: 09/15/2016  PRIMARY CARE PHYSICIAN: Keith Rake, MD    ADMISSION DIAGNOSIS:  Syncope and collapse [R55] Syncope [R55]  DISCHARGE DIAGNOSIS:  Active Problems:   Syncope   SECONDARY DIAGNOSIS:   Past Medical History:  Diagnosis Date  . Abnormal Pap smear    H/O Multiple Abnl paps--no colposcopy, no treatment to cervix--only repeat paps  . Bipolar illness ([place])   . BRCA negative 2011   sequencing only  . Dysmenorrhea   . Eczema   . Elevated hemoglobin A1c 12/17/14  . Elevated TSH 12/17/14  . Family history of malignant neoplasm of breast   . Fibroid    TLH 09/17/08  . GERD (gastroesophageal reflux disease)   . Hyperlipidemia 12/17/14    HOSPITAL COURSE:   51 y/o female with episodes of syncope.  1. Syncope from dehydration Troponin was negative and tele shows no abnormal heart rhythm.  2. Numbness of both arms: Cervical MRI Shows normal cervical cord. There are mild osteophytes at C5/C6  She can have outpatient follow-up with her primary care physician. 3. BPAD: Continue outpatient medications    DISCHARGE CONDITIONS AND DIET:   Stable Regular diet  CONSULTS OBTAINED:  Treatment Team:  Catarina Hartshorn, MD  DRUG ALLERGIES:   Allergies  Allergen Reactions  . Chloraprep One Step [Chlorhexidine Gluconate] Rash    Used before breast bx    DISCHARGE MEDICATIONS:   Current Discharge Medication List    CONTINUE these medications which have NOT CHANGED   Details  amitriptyline (ELAVIL) 25 MG tablet TAKE 1 TABLET (25 MG TOTAL) BY MOUTH AT BEDTIME. Qty: 90 tablet, Refills: 3    Ascorbic Acid (VITAMIN C PO) Take 1 tablet by mouth daily.    B Complex Vitamins (VITAMIN B COMPLEX PO) Take by mouth daily.    divalproex (DEPAKOTE ER) 500 MG 24 hr tablet  Take 1,000 mg by mouth daily. PM    gabapentin (NEURONTIN) 300 MG capsule Take 1 capsule (300 mg total) by mouth 3 (three) times daily. Qty: 270 capsule, Refills: 3    lamoTRIgine (LAMICTAL) 100 MG tablet Take 150 mg by mouth daily.     alprazolam (XANAX) 2 MG tablet Take 1 mg by mouth as needed (1/2 tab as needed).    calcipotriene (DOVONOX) 0.005 % ointment Apply 1 application topically as needed.    clobetasol (TEMOVATE) 0.05 % external solution Apply 1 application topically as needed.    fluticasone (CUTIVATE) 0.05 % cream Apply 1 application topically as needed.    HYDROcodone-acetaminophen (NORCO) 5-325 MG tablet Take 1-2 tablets by mouth every 6 (six) hours as needed for moderate pain. MAXIMUM TOTAL ACETAMINOPHEN DOSE IS 4000 MG PER DAY Qty: 40 tablet, Refills: 0          Today   CHIEF COMPLAINT:  Doing well this am  No events of telemetry.   VITAL SIGNS:  Blood pressure (!) 102/55, pulse 71, temperature 98.2 F (36.8 C), temperature source Oral, resp. rate 18, height 5' 6"  (1.676 m), weight 83.9 kg (185 lb), last menstrual period 08/16/2008, SpO2 96 %.   REVIEW OF SYSTEMS:  Review of Systems  Constitutional: Negative.  Negative for chills, fever and malaise/fatigue.  HENT: Negative.  Negative for ear discharge, ear pain, hearing loss, nosebleeds and sore  throat.   Eyes: Negative.  Negative for blurred vision and pain.  Respiratory: Negative.  Negative for cough, hemoptysis, shortness of breath and wheezing.   Cardiovascular: Negative.  Negative for chest pain, palpitations and leg swelling.  Gastrointestinal: Negative.  Negative for abdominal pain, blood in stool, diarrhea, nausea and vomiting.  Genitourinary: Negative.  Negative for dysuria.  Musculoskeletal: Negative.  Negative for back pain.  Skin: Negative.   Neurological: Negative for dizziness, tremors, speech change, focal weakness, seizures and headaches.  Endo/Heme/Allergies: Negative.  Does not  bruise/bleed easily.  Psychiatric/Behavioral: Negative.  Negative for depression, hallucinations and suicidal ideas.     PHYSICAL EXAMINATION:  GENERAL:  51 y.o.-year-old patient lying in the bed with no acute distress.  NECK:  Supple, no jugular venous distention. No thyroid enlargement, no tenderness.  LUNGS: Normal breath sounds bilaterally, no wheezing, rales,rhonchi  No use of accessory muscles of respiration.  CARDIOVASCULAR: S1, S2 normal. No murmurs, rubs, or gallops.  ABDOMEN: Soft, non-tender, non-distended. Bowel sounds present. No organomegaly or mass.  EXTREMITIES: No pedal edema, cyanosis, or clubbing.  PSYCHIATRIC: The patient is alert and oriented x 3.  SKIN: No obvious rash, lesion, or ulcer.   DATA REVIEW:   CBC  Recent Labs Lab 09/15/16 0550  WBC 7.2  HGB 12.6  HCT 37.9  PLT 224    Chemistries   Recent Labs Lab 09/14/16 1157 09/15/16 0550  NA  --  139  K  --  3.8  CL  --  108  CO2  --  26  GLUCOSE  --  93  BUN  --  14  CREATININE  --  0.69  CALCIUM  --  8.4*  MG 2.1  --     Cardiac Enzymes  Recent Labs Lab 09/14/16 1157  [place] <0.03    Microbiology Results  @MICRORSLT48 @  RADIOLOGY:  Ct Head Wo Contrast  Result Date: 09/14/2016 CLINICAL DATA:  Patient status post syncopal episode. EXAM: CT HEAD WITHOUT CONTRAST TECHNIQUE: Contiguous axial images were obtained from the base of the skull through the vertex without intravenous contrast. COMPARISON:  None. FINDINGS: Brain: No evidence of acute infarction, hemorrhage, hydrocephalus, extra-axial collection or mass lesion/mass effect. Vascular: Unremarkable Skull: Intact. Sinuses/Orbits: Paranasal sinuses are well aerated. Mastoid air cells unremarkable. Orbits are unremarkable. Other: None. IMPRESSION: No acute intracranial process. Electronically Signed   By: Lovey Newcomer M.D.   On: 09/14/2016 11:03   Dg Chest Port 1 View  Result Date: 09/14/2016 CLINICAL DATA:  Syncope, bilateral ear  pain, right hand tingling and right arm numbness. EXAM: PORTABLE CHEST 1 VIEW COMPARISON:  CT chest 05/07/2015 and chest radiograph 01/16/2015. FINDINGS: Trachea is midline. Heart size normal. Calcified mediastinal lymph nodes are better seen on 05/07/2015. Right middle lobe granulomas. Lungs are otherwise clear. No pleural fluid. IMPRESSION: No acute findings. Electronically Signed   By: Lorin Picket M.D.   On: 09/14/2016 11:43      Current Discharge Medication List    CONTINUE these medications which have NOT CHANGED   Details  amitriptyline (ELAVIL) 25 MG tablet TAKE 1 TABLET (25 MG TOTAL) BY MOUTH AT BEDTIME. Qty: 90 tablet, Refills: 3    Ascorbic Acid (VITAMIN C PO) Take 1 tablet by mouth daily.    B Complex Vitamins (VITAMIN B COMPLEX PO) Take by mouth daily.    divalproex (DEPAKOTE ER) 500 MG 24 hr tablet Take 1,000 mg by mouth daily. PM    gabapentin (NEURONTIN) 300 MG capsule Take 1 capsule (300  mg total) by mouth 3 (three) times daily. Qty: 270 capsule, Refills: 3    lamoTRIgine (LAMICTAL) 100 MG tablet Take 150 mg by mouth daily.     alprazolam (XANAX) 2 MG tablet Take 1 mg by mouth as needed (1/2 tab as needed).    calcipotriene (DOVONOX) 0.005 % ointment Apply 1 application topically as needed.    clobetasol (TEMOVATE) 0.05 % external solution Apply 1 application topically as needed.    fluticasone (CUTIVATE) 0.05 % cream Apply 1 application topically as needed.    HYDROcodone-acetaminophen (NORCO) 5-325 MG tablet Take 1-2 tablets by mouth every 6 (six) hours as needed for moderate pain. MAXIMUM TOTAL ACETAMINOPHEN DOSE IS 4000 MG PER DAY Qty: 40 tablet, Refills: 0          Management plans discussed with the patient and she is in agreement. Stable for discharge home  Patient should follow up with pcp  CODE STATUS:     Code Status Orders        Start     Ordered   09/14/16 1816  Full code  Continuous     09/14/16 1815    Code Status History     Date Active Date Inactive Code Status Order ID Comments User Context   04/24/2015  1:32 PM 04/24/2015  5:04 PM Full Code [id number]  Corky Mull, MD Inpatient      TOTAL TIME TAKING CARE OF THIS PATIENT: 37 minutes.    Note: This dictation was prepared with Dragon dictation along with smaller phrase technology. Any transcriptional errors that result from this process are unintentional.  Roth Ress M.D on 09/15/2016 at 11:14 AM  Between 7am to 6pm - Pager - [phone] After 6pm go to [email or web address] - password [email or web address] [facility]  Office  [phone]  CC: Primary care physician; Keith Rake, MD

## 2016-09-15 NOTE — Progress Notes (Signed)
Pt c/o tingling in both arms for the past few hours. On admission she states that only the right arm tingled. Neuro checks have been fine and telemetry is normal. MD made aware. Will continue to monitor.

## 2016-09-16 LAB — HIV ANTIBODY (ROUTINE TESTING W REFLEX): HIV SCREEN 4TH GENERATION: NONREACTIVE

## 2016-09-22 ENCOUNTER — Ambulatory Visit: Payer: Managed Care, Other (non HMO) | Admitting: Family Medicine

## 2016-11-21 ENCOUNTER — Other Ambulatory Visit: Payer: Self-pay | Admitting: Obstetrics and Gynecology

## 2016-11-23 NOTE — Telephone Encounter (Signed)
Medication refill request: gabapentin  Last AEX:  12/18/15 JJ Next AEX: 12/21/16 Dr. Quincy Simmonds  Last MMG (if hormonal medication request): 06/07/14 BIRADS1:neg  Refill authorized: 12/18/15 #270caps/ 3 R. Today please advise.

## 2016-12-11 ENCOUNTER — Other Ambulatory Visit: Payer: Self-pay | Admitting: Obstetrics and Gynecology

## 2016-12-11 NOTE — Telephone Encounter (Signed)
Medication refill request: Amitriptyline 25mg  Last AEX:  12/18/15 JJ Next AEX: 12/21/16 Last MMG (if hormonal medication request): 06/07/14 BIRADS 1 negative/density c Refill authorized: 12/09/15 #90 w/3 refills; today please advise

## 2016-12-18 ENCOUNTER — Telehealth: Payer: Self-pay | Admitting: Obstetrics and Gynecology

## 2016-12-18 NOTE — Telephone Encounter (Signed)
Left patient a message to call back to reschedule a future appointment that was cancelled by the provider. °

## 2016-12-21 ENCOUNTER — Ambulatory Visit: Payer: Commercial Managed Care - PPO | Admitting: Obstetrics and Gynecology

## 2016-12-21 ENCOUNTER — Ambulatory Visit: Payer: Managed Care, Other (non HMO) | Admitting: Obstetrics and Gynecology

## 2017-01-09 ENCOUNTER — Other Ambulatory Visit: Payer: Self-pay | Admitting: Obstetrics and Gynecology

## 2017-03-26 ENCOUNTER — Other Ambulatory Visit: Payer: Self-pay | Admitting: Family Medicine

## 2017-03-26 ENCOUNTER — Ambulatory Visit: Payer: Commercial Managed Care - PPO | Admitting: Family Medicine

## 2017-03-26 ENCOUNTER — Encounter: Payer: Self-pay | Admitting: Family Medicine

## 2017-03-26 ENCOUNTER — Other Ambulatory Visit
Admission: RE | Admit: 2017-03-26 | Discharge: 2017-03-26 | Disposition: A | Discharge status: Home / Self Care | Payer: Commercial Managed Care - PPO | Source: Ambulatory Visit | Attending: Family Medicine | Admitting: Family Medicine

## 2017-03-26 ENCOUNTER — Ambulatory Visit
Admission: RE | Admit: 2017-03-26 | Discharge: 2017-03-26 | Disposition: A | Discharge status: Home / Self Care | Payer: Commercial Managed Care - PPO | Source: Ambulatory Visit | Attending: Family Medicine | Admitting: Family Medicine

## 2017-03-26 ENCOUNTER — Telehealth: Payer: Self-pay | Admitting: Family Medicine

## 2017-03-26 VITALS — BP 118/72 | HR 83 | Temp 98.4°F | Resp 16 | Ht 66.0 in | Wt 188.1 lb

## 2017-03-26 DIAGNOSIS — I700 Atherosclerosis of aorta: Secondary | ICD-10-CM | POA: Diagnosis not present

## 2017-03-26 DIAGNOSIS — R319 Hematuria, unspecified: Secondary | ICD-10-CM | POA: Insufficient documentation

## 2017-03-26 DIAGNOSIS — N8110 Cystocele, unspecified: Secondary | ICD-10-CM | POA: Diagnosis not present

## 2017-03-26 DIAGNOSIS — R1030 Lower abdominal pain, unspecified: Secondary | ICD-10-CM

## 2017-03-26 DIAGNOSIS — R34 Anuria and oliguria: Secondary | ICD-10-CM | POA: Diagnosis not present

## 2017-03-26 DIAGNOSIS — N12 Tubulo-interstitial nephritis, not specified as acute or chronic: Secondary | ICD-10-CM

## 2017-03-26 DIAGNOSIS — D3501 Benign neoplasm of right adrenal gland: Secondary | ICD-10-CM | POA: Insufficient documentation

## 2017-03-26 DIAGNOSIS — M545 Low back pain, unspecified: Secondary | ICD-10-CM

## 2017-03-26 DIAGNOSIS — L928 Other granulomatous disorders of the skin and subcutaneous tissue: Secondary | ICD-10-CM | POA: Insufficient documentation

## 2017-03-26 DIAGNOSIS — R109 Unspecified abdominal pain: Secondary | ICD-10-CM

## 2017-03-26 DIAGNOSIS — K753 Granulomatous hepatitis, not elsewhere classified: Secondary | ICD-10-CM | POA: Diagnosis not present

## 2017-03-26 LAB — POCT URINALYSIS DIPSTICK
Bilirubin, UA: NEGATIVE
GLUCOSE UA: NEGATIVE
Ketones, UA: NEGATIVE
NITRITE UA: NEGATIVE
Spec Grav, UA: 1.02 (ref 1.010–1.025)
UROBILINOGEN UA: 0.2 U/dL
pH, UA: 5 (ref 5.0–8.0)

## 2017-03-26 LAB — COMPREHENSIVE METABOLIC PANEL
ALBUMIN: 4.6 g/dL (ref 3.5–5.0)
ALK PHOS: 67 U/L (ref 38–126)
ALT: 13 U/L — AB (ref 14–54)
AST: 21 U/L (ref 15–41)
Anion gap: 11 (ref 5–15)
BUN: 15 mg/dL (ref 6–20)
CHLORIDE: 100 mmol/L — AB (ref 101–111)
CO2: 25 mmol/L (ref 22–32)
CREATININE: 0.74 mg/dL (ref 0.44–1.00)
Calcium: 9.4 mg/dL (ref 8.9–10.3)
GFR calc non Af Amer: >60 mL/min (ref >60)
GLUCOSE: 104 mg/dL — AB (ref 65–99)
Potassium: 3.9 mmol/L (ref 3.5–5.1)
SODIUM: 136 mmol/L (ref 135–145)
Total Bilirubin: 1 mg/dL (ref 0.3–1.2)
Total Protein: 7.8 g/dL (ref 6.5–8.1)

## 2017-03-26 LAB — CBC
HCT: 43.3 % (ref 35.0–47.0)
Hemoglobin: 14.8 g/dL (ref 12.0–16.0)
MCH: 31.6 pg (ref 26.0–34.0)
MCHC: 34.1 g/dL (ref 32.0–36.0)
MCV: 92.6 fL (ref 80.0–100.0)
PLATELETS: 217 10*3/uL (ref 150–440)
RBC: 4.68 MIL/uL (ref 3.80–5.20)
RDW: 14.1 % (ref 11.5–14.5)
WBC: 9.7 10*3/uL (ref 3.6–11.0)

## 2017-03-26 MED ORDER — NAPROXEN 500 MG PO TABS: 500.0000 mg | ORAL_TABLET | Freq: Two times a day (BID) | ORAL | 0 refills | Status: DC

## 2017-03-26 MED ORDER — CIPROFLOXACIN HCL 500 MG PO TABS: 500.0000 mg | ORAL_TABLET | Freq: Two times a day (BID) | ORAL | 0 refills | Status: AC

## 2017-03-26 NOTE — Telephone Encounter (Signed)
Copied from [place] [id number]. Topic: Quick Communication - See Telephone Encounter >> Mar 26, 2017 12:23 PM Bea Graff, NT wrote: CRM for notification. See Telephone encounter for: Patient was seen this morning by Raelyn Ensign this morning and was told 3 medications would [facility] [address], but [facility] called [facility] and they have not received any of these. Please confirm.  03/26/17.

## 2017-03-26 NOTE — Progress Notes (Signed)
Name: Alicia Long   MRN: [id number]    DOB: 08/24/1965   Date:03/26/2017       Progress Note  Subjective  Chief Complaint  Chief Complaint  Patient presents with  . Urinary symptoms    burning, odor, frequent urination,low back pain for 3 weeks    HPI  Patient presents with concern for kidney issue/possible UTI - progressively worsening low to mid back pain radiating to bilateral lower abdomen/groin area x3 weeks accompanied by decreased urination, nausea, dysuria, and foul smelling urine.  She does not have a history of kidney stones in the past, does have a history of cystitis. Denies frank blood in the urine, fevers, chills, vomiting, or muscle aches.  Patient Active Problem List   Diagnosis Date Noted  . Syncope 09/14/2016  . Trigger finger of right thumb 04/25/2015  . Pain of right thumb 03/19/2015  . Bipolar disorder ([place]) 03/19/2015  . Heartburn   . Gastritis   . Effusion of knee 10/26/2014  . Knee strain 10/26/2014  . Family history of malignant neoplasm of breast   . BRCA negative   . Bipolar illness [facility])     Social History   Tobacco Use  . Smoking status: Current Every Day Smoker    Packs/day: 0.50    Years: 33.00    Pack years: 16.50    Types: Cigarettes    Start date: 11/16/1983  . Smokeless tobacco: Never Used  . Tobacco comment: smokes 5 cigs/day--trying to quit!!  Substance Use Topics  . Alcohol use: Yes    Alcohol/week: 0.0 oz    Comment: 2-3 drinks/year     Current Outpatient Medications:  .  divalproex (DEPAKOTE ER) 500 MG 24 hr tablet, Take 1,000 mg by mouth daily. PM, Disp: , Rfl:  .  alprazolam (XANAX) 2 MG tablet, Take 1 mg by mouth as needed (1/2 tab as needed)., Disp: , Rfl:  .  clobetasol (TEMOVATE) 0.05 % external solution, Apply 1 application topically as needed., Disp: , Rfl:  .  fluticasone (CUTIVATE) 0.05 % cream, Apply 1 application topically as needed., Disp: , Rfl:  .  lamoTRIgine (LAMICTAL) 150 MG tablet, Take 150 mg at bedtime by  mouth., Disp: , Rfl: 1 .  Multiple Vitamin (MULTI-VITAMINS) TABS, Take 1 tablet daily by mouth., Disp: , Rfl:   Allergies  Allergen Reactions  . Chloraprep One Step [Chlorhexidine Gluconate] Rash    Used before breast bx    ROS  Constitutional: Negative for fever or weight change.  Respiratory: Negative for cough and shortness of breath.   Cardiovascular: Negative for chest pain or palpitations.  Gastrointestinal: Positive for abdominal pain; has had loose stools over the last few days.  GU: See HPI Musculoskeletal: Negative for gait problem or joint swelling.  Skin: Negative for rash.  Neurological: Negative for dizziness or headache.  No other specific complaints in a complete review of systems (except as listed in HPI above).  Objective  Vitals:   03/26/17 0909  BP: 118/72  Pulse: 83  Resp: 16  Temp: 98.4 F (36.9 C)  TempSrc: Oral  SpO2: 96%  Weight: 188 lb 1.6 oz (85.3 kg)  Height: _0  (1.676 m)   Body mass index is 30.36 kg/m.  Nursing Note and Vital Signs reviewed.  Physical Exam  Constitutional: Patient appears well-developed and well-nourished. Obese No distress.  HEENT: head atraumatic, normocephalic Cardiovascular: Normal rate, regular rhythm, S1/S2 present.  No murmur or rub heard. No BLE edema. Pulmonary/Chest: Effort normal and  breath sounds clear. No respiratory distress or retractions. Abdominal: Soft and non-tender, bowel sounds present x4 quadrants.  Moderate CVA Tenderness present bilaterally. Psychiatric: Patient has a normal mood and affect. behavior is normal. Judgment and thought content normal.  Recent Results (from the past 2160 hour(s))  POCT urinalysis dipstick     Status: Abnormal   Collection Time: 03/26/17  9:24 AM  Result Value Ref Range   Color, UA gold    Clarity, UA cloudy    Glucose, UA negative    Bilirubin, UA negative    Ketones, UA negative    Spec Grav, UA 1.020 1.010 - 1.025   Blood, UA large    pH, UA 5.0 5.0 -  8.0   Protein, UA trace    Urobilinogen, UA 0.2 0.2 or 1.0 E.U./dL   Nitrite, UA negative    Leukocytes, UA Trace (A) Negative     Assessment & Plan  1. Bilateral flank pain - POCT urinalysis dipstick - CT RENAL STONE STUDY; Future - CBC - Comprehensive metabolic panel  2. Decreased urination - POCT urinalysis dipstick - CT RENAL STONE STUDY; Future - CBC - Comprehensive metabolic panel  3. Hematuria, unspecified type - CT RENAL STONE STUDY; Future - CBC - Comprehensive metabolic panel  4. Lower abdominal pain - POCT urinalysis dipstick - CT RENAL STONE STUDY; Future - CBC - Comprehensive metabolic panel  - Pt to present to [facility] for stat CT renal study, then to [place] at [facility] for stat labs.  -Red flags and when to present for emergency care or RTC including fever >101.48F, chest pain, shortness of breath, new/worsening/un-resolving symptoms, severe pain, vomiting, frank hematuria, reviewed with patient at time of visit. Follow up and care instructions discussed and provided in AVS.

## 2017-03-26 NOTE — Patient Instructions (Addendum)
Please go directly across the street to the [facility] Please go directly to [facility] hospital for lab work.

## 2017-03-26 NOTE — Telephone Encounter (Signed)
See Result note at 1:00 by Hollie Salk RMA - pt has been notified and medications sent.

## 2017-03-29 LAB — URINE CULTURE
MICRO NUMBER:: 81265720
SPECIMEN QUALITY:: ADEQUATE

## 2017-04-02 ENCOUNTER — Encounter: Payer: Self-pay | Admitting: Family Medicine

## 2017-07-01 ENCOUNTER — Telehealth: Payer: Self-pay

## 2017-07-01 NOTE — Telephone Encounter (Signed)
Called pt on 06/11/2017 and left a message on her voicemail asking her to come in and schedule an apt regarding the concerns of lab work; Lymphs was high. that was done by Dr. Donnal Moat I was going through all my work that pt has not call back and I saw that she has not.  Spoke to the Pt and she refuse an apt due saying that the Doctor wanted to have those labs faxed over to Dr. Manuella Ghazi so we can have it on our end and these labs are done every year due to the medication she takes. Pt also states that she spoke to Dr. Adelene Idler nurse after receiving my call to see if it was a big concern and her nurse states it was not and that it was just to make sure we had it on our end. Mention to patient that Dr. Manuella Ghazi last day is tomorrow if and pt understood and states she will call back if she need anything and that she would see Emily boyce.

## 2017-07-05 ENCOUNTER — Encounter: Payer: Self-pay | Admitting: Family Medicine

## 2017-10-20 ENCOUNTER — Ambulatory Visit: Payer: Commercial Managed Care - PPO | Admitting: Obstetrics and Gynecology

## 2017-10-24 ENCOUNTER — Other Ambulatory Visit: Payer: Self-pay

## 2017-10-24 ENCOUNTER — Emergency Department
Admission: EM | Admit: 2017-10-24 | Discharge: 2017-10-24 | Disposition: A | Discharge status: Home / Self Care | Payer: Commercial Managed Care - PPO | Attending: Emergency Medicine | Admitting: Emergency Medicine

## 2017-10-24 ENCOUNTER — Emergency Department: Payer: Commercial Managed Care - PPO

## 2017-10-24 ENCOUNTER — Encounter: Payer: Self-pay | Admitting: Emergency Medicine

## 2017-10-24 DIAGNOSIS — F319 Bipolar disorder, unspecified: Secondary | ICD-10-CM | POA: Diagnosis not present

## 2017-10-24 DIAGNOSIS — Y9389 Activity, other specified: Secondary | ICD-10-CM | POA: Insufficient documentation

## 2017-10-24 DIAGNOSIS — S39012A Strain of muscle, fascia and tendon of lower back, initial encounter: Secondary | ICD-10-CM

## 2017-10-24 DIAGNOSIS — X509XXA Other and unspecified overexertion or strenuous movements or postures, initial encounter: Secondary | ICD-10-CM | POA: Insufficient documentation

## 2017-10-24 DIAGNOSIS — Z9049 Acquired absence of other specified parts of digestive tract: Secondary | ICD-10-CM | POA: Insufficient documentation

## 2017-10-24 DIAGNOSIS — Y92002 Bathroom of unspecified non-institutional (private) residence single-family (private) house as the place of occurrence of the external cause: Secondary | ICD-10-CM | POA: Insufficient documentation

## 2017-10-24 DIAGNOSIS — F17210 Nicotine dependence, cigarettes, uncomplicated: Secondary | ICD-10-CM | POA: Insufficient documentation

## 2017-10-24 DIAGNOSIS — Z79899 Other long term (current) drug therapy: Secondary | ICD-10-CM | POA: Insufficient documentation

## 2017-10-24 DIAGNOSIS — Y998 Other external cause status: Secondary | ICD-10-CM | POA: Diagnosis not present

## 2017-10-24 DIAGNOSIS — S3992XA Unspecified injury of lower back, initial encounter: Secondary | ICD-10-CM | POA: Diagnosis present

## 2017-10-24 MED ORDER — ORPHENADRINE CITRATE ER 100 MG PO TB12: 100.0000 mg | ORAL_TABLET | Freq: Two times a day (BID) | ORAL | 0 refills | Status: DC

## 2017-10-24 MED ORDER — KETOROLAC TROMETHAMINE 60 MG/2ML IM SOLN
60.0000 mg | Freq: Once | INTRAMUSCULAR | Status: AC
  Administered 2017-10-24: 60 mg via INTRAMUSCULAR
  Filled 2017-10-24: qty 2

## 2017-10-24 MED ORDER — KETOROLAC TROMETHAMINE 10 MG PO TABS: 10.0000 mg | ORAL_TABLET | Freq: Four times a day (QID) | ORAL | 0 refills | Status: DC | PRN

## 2017-10-24 MED ORDER — HYDROMORPHONE HCL 1 MG/ML IJ SOLN
1.0000 mg | Freq: Once | INTRAMUSCULAR | Status: AC
  Administered 2017-10-24: 1 mg via INTRAMUSCULAR
  Filled 2017-10-24: qty 1

## 2017-10-24 MED ORDER — CYCLOBENZAPRINE HCL 10 MG PO TABS
10.0000 mg | ORAL_TABLET | Freq: Once | ORAL | Status: AC
  Administered 2017-10-24: 10 mg via ORAL
  Filled 2017-10-24: qty 1

## 2017-10-24 MED ORDER — OXYCODONE-ACETAMINOPHEN 7.5-325 MG PO TABS: 1.0000 | ORAL_TABLET | Freq: Four times a day (QID) | ORAL | 0 refills | Status: DC | PRN

## 2017-10-24 NOTE — Discharge Instructions (Signed)
Follow discharge care instruction and did not take narcotic or muscle relaxers at work.  May take anti-inflammatory medication at work.
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
## 2017-10-24 NOTE — ED Triage Notes (Signed)
While bending down in the shower, felt a pulling sensation and then lower back started to hurt.   C/O pain across lower back.

## 2017-10-25 ENCOUNTER — Ambulatory Visit (INDEPENDENT_AMBULATORY_CARE_PROVIDER_SITE_OTHER): Payer: Commercial Managed Care - PPO | Admitting: Family Medicine

## 2017-10-25 ENCOUNTER — Encounter: Payer: Self-pay | Admitting: Family Medicine

## 2017-10-25 VITALS — BP 98/58 | HR 68 | Temp 98.0°F | Resp 16 | Ht 66.0 in | Wt 187.9 lb

## 2017-10-25 DIAGNOSIS — Z131 Encounter for screening for diabetes mellitus: Secondary | ICD-10-CM

## 2017-10-25 DIAGNOSIS — M545 Low back pain, unspecified: Secondary | ICD-10-CM

## 2017-10-25 DIAGNOSIS — I700 Atherosclerosis of aorta: Secondary | ICD-10-CM | POA: Diagnosis not present

## 2017-10-25 DIAGNOSIS — F3162 Bipolar disorder, current episode mixed, moderate: Secondary | ICD-10-CM

## 2017-10-25 DIAGNOSIS — Z1211 Encounter for screening for malignant neoplasm of colon: Secondary | ICD-10-CM | POA: Diagnosis not present

## 2017-10-25 DIAGNOSIS — Z79899 Other long term (current) drug therapy: Secondary | ICD-10-CM

## 2017-10-25 DIAGNOSIS — D72820 Lymphocytosis (symptomatic): Secondary | ICD-10-CM

## 2017-10-25 MED ORDER — NAPROXEN 500 MG PO TABS: 500.0000 mg | ORAL_TABLET | Freq: Two times a day (BID) | ORAL | 0 refills | Status: DC

## 2017-10-25 MED ORDER — ATORVASTATIN CALCIUM 40 MG PO TABS
40.0000 mg | ORAL_TABLET | Freq: Every day | ORAL | 1 refills | Status: DC
Start: 2017-10-25 — End: 2018-07-13

## 2017-10-25 MED ORDER — ORPHENADRINE CITRATE ER 100 MG PO TB12: 100.0000 mg | ORAL_TABLET | Freq: Two times a day (BID) | ORAL | 0 refills | Status: DC
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
## 2017-10-26 LAB — COMPLETE METABOLIC PANEL WITH GFR
AG Ratio: 1.9 (calc) (ref 1.0–2.5)
ALBUMIN MSPROF: 4 g/dL (ref 3.6–5.1)
ALKALINE PHOSPHATASE (APISO): 60 U/L (ref 33–130)
ALT: 8 U/L (ref 6–29)
AST: 12 U/L (ref 10–35)
BILIRUBIN TOTAL: 0.2 mg/dL (ref 0.2–1.2)
BUN: 21 mg/dL (ref 7–25)
CO2: 29 mmol/L (ref 20–32)
CREATININE: 0.85 mg/dL (ref 0.50–1.05)
Calcium: 9.1 mg/dL (ref 8.6–10.4)
Chloride: 106 mmol/L (ref 98–110)
GFR, Est African American: 93 mL/min/{1.73_m2} (ref >=60)
GFR, Est Non African American: 80 mL/min/{1.73_m2} (ref >=60)
GLUCOSE: 97 mg/dL (ref 65–139)
Globulin: 2.1 g/dL (calc) (ref 1.9–3.7)
Potassium: 4.3 mmol/L (ref 3.5–5.3)
Sodium: 140 mmol/L (ref 135–146)
Total Protein: 6.1 g/dL (ref 6.1–8.1)

## 2017-10-26 LAB — LIPID PANEL
CHOL/HDL RATIO: 4.5 (calc) (ref <5.0)
Cholesterol: 187 mg/dL (ref <200)
HDL: 42 mg/dL — ABNORMAL LOW (ref >50)
LDL CHOLESTEROL (CALC): 120 mg/dL — AB
NON-HDL CHOLESTEROL (CALC): 145 mg/dL — AB (ref <130)
Triglycerides: 134 mg/dL (ref <150)

## 2017-10-26 LAB — CBC WITH DIFFERENTIAL/PLATELET
BASOS ABS: 19 {cells}/uL (ref 0–200)
Basophils Relative: 0.2 %
EOS PCT: 1.8 %
Eosinophils Absolute: 173 cells/uL (ref 15–500)
HCT: 39.6 % (ref 35.0–45.0)
HEMOGLOBIN: 13.5 g/dL (ref 11.7–15.5)
Lymphs Abs: 4963 cells/uL — ABNORMAL HIGH (ref 850–3900)
MCH: 31.5 pg (ref 27.0–33.0)
MCHC: 34.1 g/dL (ref 32.0–36.0)
MCV: 92.5 fL (ref 80.0–100.0)
MONOS PCT: 6 %
MPV: 11.7 fL (ref 7.5–12.5)
NEUTROS ABS: 3869 {cells}/uL (ref 1500–7800)
Neutrophils Relative %: 40.3 %
PLATELETS: 201 10*3/uL (ref 140–400)
RBC: 4.28 10*6/uL (ref 3.80–5.10)
RDW: 12.7 % (ref 11.0–15.0)
TOTAL LYMPHOCYTE: 51.7 %
WBC mixed population: 576 cells/uL (ref 200–950)
WBC: 9.6 10*3/uL (ref 3.8–10.8)

## 2017-10-26 LAB — HEMOGLOBIN A1C
EAG (MMOL/L): 5.8 (calc)
Hgb A1c MFr Bld: 5.3 % of total Hgb (ref <5.7)
Mean Plasma Glucose: 105 (calc)

## 2017-10-27 ENCOUNTER — Telehealth: Payer: Self-pay

## 2017-10-27 NOTE — Telephone Encounter (Signed)
Patient needs note today. Call back [phone]

## 2017-10-27 NOTE — Telephone Encounter (Signed)
Okay to give note 

## 2017-10-27 NOTE — Telephone Encounter (Signed)
Copied from [place] [id number]. Topic: Inquiry >> Oct 27, 2017  8:36 AM Pricilla Handler wrote: Reason for CRM: Patient called stating that she needs a return to work note today. Patient saw Dr. Ancil Boozer on Monday at 3:20pm. Patient's appt ran late, and when she left, the ladies at the front desk were already gone. Please prepare the note for pickup today. Patient will return to work tomorrow. Please call patient also.       Thank You!!!

## 2017-11-09 ENCOUNTER — Ambulatory Visit: Payer: Commercial Managed Care - PPO | Admitting: Obstetrics and Gynecology

## 2018-02-21 ENCOUNTER — Encounter: Payer: Self-pay | Admitting: Emergency Medicine

## 2018-02-21 ENCOUNTER — Encounter: Payer: Self-pay | Admitting: Family Medicine

## 2018-02-21 ENCOUNTER — Ambulatory Visit: Payer: Commercial Managed Care - PPO | Admitting: Family Medicine

## 2018-02-21 VITALS — BP 112/68 | HR 82 | Temp 98.9°F | Resp 16 | Ht 66.0 in | Wt 188.1 lb

## 2018-02-21 DIAGNOSIS — Z8269 Family history of other diseases of the musculoskeletal system and connective tissue: Secondary | ICD-10-CM | POA: Diagnosis not present

## 2018-02-21 DIAGNOSIS — J0140 Acute pansinusitis, unspecified: Secondary | ICD-10-CM | POA: Diagnosis not present

## 2018-02-21 MED ORDER — AMOXICILLIN-POT CLAVULANATE 875-125 MG PO TABS: 1.0000 | ORAL_TABLET | Freq: Two times a day (BID) | ORAL | 0 refills | Status: AC

## 2018-02-21 NOTE — Progress Notes (Signed)
Name: Alicia Long   MRN: [id number]    DOB: Aug 31, 1965   Date:02/21/2018       Progress Note  Subjective  Chief Complaint  Chief Complaint  Patient presents with  . URI    runny nose, cough at night, congested, headache muscle ache for 1 week    HPI  Pt presents with concern for respiratory illness - rhinorrhea, cough at night, headache, body aches for about a week; also endorses right ear pressure, sinus pressure bilateral frontal and right maxillary.  Denies chest pain or shortness of breath, NVD, abdominal pain, fevers/chills, left ear pain/pressure.  Recent sick contact - husband has been sick.  She has not been taking any medications for her illness.  Patient Active Problem List   Diagnosis Date Noted  . Syncope 09/14/2016  . Trigger finger of right thumb 04/25/2015  . Pain of right thumb 03/19/2015  . Bipolar disorder ([place]) 03/19/2015  . Heartburn   . Gastritis   . Effusion of knee 10/26/2014  . Knee strain 10/26/2014  . Family history of malignant neoplasm of breast   . BRCA negative     Social History   Tobacco Use  . Smoking status: Current Every Day Smoker    Packs/day: 0.50    Years: 33.00    Pack years: 16.50    Types: Cigarettes    Start date: 11/16/1983    Last attempt to quit: 02/19/2017    Years since quitting: 1.0  . Smokeless tobacco: Never Used  Substance Use Topics  . Alcohol use: Yes    Alcohol/week: 0.0 standard drinks    Comment: 2-3 drinks/year     Current Outpatient Medications:  .  alprazolam (XANAX) 2 MG tablet, Take 1 mg by mouth as needed (1/2 tab as needed)., Disp: , Rfl:  .  atorvastatin (LIPITOR) 40 MG tablet, Take 1 tablet (40 mg total) by mouth daily., Disp: 90 tablet, Rfl: 1 .  clobetasol (TEMOVATE) 0.05 % external solution, Apply 1 application topically as needed., Disp: , Rfl:  .  divalproex (DEPAKOTE ER) 500 MG 24 hr tablet, Take 1,000 mg by mouth daily. PM, Disp: , Rfl:  .  fluticasone (CUTIVATE) 0.05 % cream, Apply 1  application topically as needed., Disp: , Rfl:  .  lamoTRIgine (LAMICTAL) 150 MG tablet, Take 150 mg at bedtime by mouth., Disp: , Rfl: 1 .  Multiple Vitamin (MULTI-VITAMINS) TABS, Take 1 tablet daily by mouth., Disp: , Rfl:  .  naproxen (NAPROSYN) 500 MG tablet, Take 1 tablet (500 mg total) by mouth 2 (two) times daily with a meal., Disp: 20 tablet, Rfl: 0 .  orphenadrine (NORFLEX) 100 MG tablet, Take 1 tablet (100 mg total) by mouth 2 (two) times daily., Disp: 20 tablet, Rfl: 0 .  oxyCODONE-acetaminophen (PERCOCET) 7.5-325 MG tablet, Take 1 tablet by mouth every 6 (six) hours as needed for severe pain., Disp: 12 tablet, Rfl: 0 .  propranolol (INDERAL) 20 MG tablet, THEN 1 TABLET BY MOUTH TWICE DAILY, Disp: , Rfl: 1  Allergies  Allergen Reactions  . Chloraprep One Step [Chlorhexidine Gluconate] Rash    Used before breast bx    I personally reviewed active problem list, medication list, allergies, lab results with the patient/caregiver today.  ROS  Ten systems reviewed and is negative except as mentioned in HPI   Objective  Vitals:   02/21/18 1323  BP: 112/68  Pulse: 82  Resp: 16  Temp: 98.9 F (37.2 C)  TempSrc: Oral  SpO2:  96%  Weight: 188 lb 1.6 oz (85.3 kg)  Height: 5' 6"  (1.676 m)   Body mass index is 30.36 kg/m.  Nursing Note and Vital Signs reviewed.  Physical Exam  Constitutional: Patient appears well-developed and well-nourished. No distress.  HEENT: head atraumatic, normocephalic, pupils equal and reactive to light, Bilateral TM's without erythema; right TM with effusion,  bilateral maxillary and frontal sinuses are notably tender, neck supple without lymphadenopathy, throat within normal limits - no erythema or exudate, no tonsillar swelling Cardiovascular: Normal rate, regular rhythm and normal heart sounds.  No murmur heard. No BLE edema. Pulmonary/Chest: Effort normal and breath sounds clear bilaterally. No respiratory distress. Abdominal: Soft, bowel sounds  normal, there is no tenderness, no HSM Psychiatric: Patient has a normal mood and affect. behavior is normal. Judgment and thought content normal.  No results found for this or any previous visit (from the past 72 hour(s)).  Assessment & Plan  1. Acute non-recurrent pansinusitis - Flonase - will buy OTC - amoxicillin-clavulanate (AUGMENTIN) 875-125 MG tablet; Take 1 tablet by mouth 2 (two) times daily for 10 days.  Dispense: 20 tablet; Refill: 0  2. Family history of systemic lupus erythematosus - She is concerned that she could have lupus due to her daughter being recently diagnosed.  She is having joint pain today.  She declines lab work today, but will consider in the future.  We will wait until she is feeling better from her current illness to better differentiate if joint aches are from acute illness vs SLE vs other etiology.   -Red flags and when to present for emergency care or RTC including fever >101.92F, chest pain, shortness of breath, new/worsening/un-resolving symptoms, pain with ocular movement reviewed with patient at time of visit. Follow up and care instructions discussed and provided in AVS.

## 2018-02-21 NOTE — Patient Instructions (Signed)

## 2018-02-25 ENCOUNTER — Encounter: Payer: Self-pay | Admitting: Emergency Medicine

## 2018-04-16 ENCOUNTER — Encounter: Payer: Self-pay | Admitting: Emergency Medicine

## 2018-04-16 DIAGNOSIS — F3175 Bipolar disorder, in partial remission, most recent episode depressed: Secondary | ICD-10-CM

## 2018-04-16 DIAGNOSIS — F419 Anxiety disorder, unspecified: Secondary | ICD-10-CM

## 2018-04-16 DIAGNOSIS — R251 Tremor, unspecified: Secondary | ICD-10-CM

## 2018-04-28 ENCOUNTER — Ambulatory Visit: Payer: Commercial Managed Care - PPO | Admitting: Physician Assistant

## 2018-04-28 ENCOUNTER — Encounter: Payer: Self-pay | Admitting: Physician Assistant

## 2018-04-28 DIAGNOSIS — F411 Generalized anxiety disorder: Secondary | ICD-10-CM | POA: Diagnosis not present

## 2018-04-28 DIAGNOSIS — F319 Bipolar disorder, unspecified: Secondary | ICD-10-CM | POA: Diagnosis not present

## 2018-04-28 DIAGNOSIS — Z79899 Other long term (current) drug therapy: Secondary | ICD-10-CM | POA: Diagnosis not present

## 2018-04-28 DIAGNOSIS — R251 Tremor, unspecified: Secondary | ICD-10-CM

## 2018-04-28 MED ORDER — DIVALPROEX SODIUM ER 500 MG PO TB24: 1000.0000 mg | ORAL_TABLET | Freq: Every day | ORAL | 1 refills | Status: DC

## 2018-04-28 MED ORDER — PROPRANOLOL HCL 20 MG PO TABS: 20.0000 mg | ORAL_TABLET | Freq: Two times a day (BID) | ORAL | 1 refills | Status: DC

## 2018-04-28 MED ORDER — LAMOTRIGINE 150 MG PO TABS: 150.0000 mg | ORAL_TABLET | Freq: Every day | ORAL | 1 refills | Status: DC

## 2018-04-28 MED ORDER — ALPRAZOLAM 2 MG PO TABS: 1.0000 mg | ORAL_TABLET | Freq: Every day | ORAL | 1 refills | Status: DC | PRN

## 2018-04-28 NOTE — Progress Notes (Addendum)
[facility]  Patient ID: Alicia Long,  MRN: [id number]  PCP: Steele Sizer, MD  Date of Evaluation: 04/28/2018 Time spent:15 minutes  Chief Complaint:  Chief Complaint    Follow-up      HISTORY/CURRENT STATUS: HPI For 6 month med check.  Doing well overall.  This time of year of course is a little sad for her.  Her grandfather, who was like a father to her, died on Christmas Day 2 years ago.  She states Christmas will never be the same.  Patient denies loss of interest in usual activities and is able to enjoy things.  Denies decreased energy or motivation.  Appetite has not changed.  No extreme sadness, tearfulness, or feelings of hopelessness.  Denies any changes in concentration, making decisions or remembering things.  Denies suicidal or homicidal thoughts.  Patient denies increased energy with decreased need for sleep, no increased talkativeness, no racing thoughts, no impulsivity or risky behaviors, no increased spending, no increased libido, no grandiosity.  Anxiety is controlled with the Xanax.  She denies tremor.  Propanolol has really helped that.  It is also helped with the anxiety some.  She sleeps well.  Work is going well.  He still works at [employer].  She enjoys her grandchildren, 2 of which are toddlers  Individual Medical History/ Review of Systems: Changes? :No    Addendum Past medications for mental health diagnoses include: Wellbutrin caused tremor, Cymbalta, Prozac  Allergies: Chloraprep one step [chlorhexidine gluconate]  Current Medications:  Current Outpatient Medications:  .  alprazolam (XANAX) 2 MG tablet, Take 0.5 tablets (1 mg total) by mouth daily as needed (1/2 tab as needed)., Disp: 90 tablet, Rfl: 1 .  atorvastatin (LIPITOR) 40 MG tablet, Take 1 tablet (40 mg total) by mouth daily., Disp: 90 tablet, Rfl: 1 .  divalproex (DEPAKOTE ER) 500 MG 24 hr tablet, Take 2 tablets (1,000 mg total) by mouth daily. PM, Disp: 180 tablet, Rfl:  1 .  lamoTRIgine (LAMICTAL) 150 MG tablet, Take 1 tablet (150 mg total) by mouth at bedtime., Disp: 90 tablet, Rfl: 1 .  Multiple Vitamin (MULTI-VITAMINS) TABS, Take 1 tablet daily by mouth., Disp: , Rfl:  .  naproxen (NAPROSYN) 500 MG tablet, Take 1 tablet (500 mg total) by mouth 2 (two) times daily with a meal., Disp: 20 tablet, Rfl: 0 .  propranolol (INDERAL) 20 MG tablet, Take 1 tablet (20 mg total) by mouth 2 (two) times daily., Disp: 180 tablet, Rfl: 1 Medication Side Effects: none  Family Medical/ Social History: Changes? No  MENTAL HEALTH EXAM:  Last menstrual period 08/16/2008.There is no height or weight on file to calculate BMI.  General Appearance: Casual and Well Groomed  Eye Contact:  Good  Speech:  Clear and Coherent  Volume:  Normal  Mood:  Euthymic  Affect:  Appropriate  Thought Process:  Goal Directed  Orientation:  Full (Time, Place, and Person)  Thought Content: Logical   Suicidal Thoughts:  No  Homicidal Thoughts:  No  Memory:  WNL  Judgement:  Good  Insight:  Good  Psychomotor Activity:  Normal  Concentration:  Concentration: Good  Recall:  Good  Fund of Knowledge: Good  Language: Good  Assets:  Desire for Improvement  ADL's:  Intact  Cognition: WNL  Prognosis:  Good    DIAGNOSES:    ICD-10-CM   1. Bipolar I disorder ([place]) F31.9   2. Tremor R25.1   3. Generalized anxiety disorder F41.1   4. Encounter for  long-term (current) use of medications Z79.899 Valproic acid level    CBC with Differential/Platelet    Comprehensive metabolic panel    Receiving Psychotherapy: No    RECOMMENDATIONS: Continue all current medications. Labs ordered as above. Return in 6 months or sooner as needed.   Donnal Moat, PA-C

## 2018-04-29 ENCOUNTER — Telehealth: Payer: Self-pay | Admitting: Physician Assistant

## 2018-04-29 ENCOUNTER — Other Ambulatory Visit: Payer: Self-pay

## 2018-04-29 MED ORDER — ALPRAZOLAM 2 MG PO TABS: 1.0000 mg | ORAL_TABLET | Freq: Every day | ORAL | 1 refills | Status: DC | PRN

## 2018-04-29 NOTE — Telephone Encounter (Signed)
[facility] [address] [facility] MSG STATING E -SCRIPT FOR APRAZOLAM 2 MG WAS UNCLEAR PLEASE ADVISE [phone]

## 2018-04-29 NOTE — Telephone Encounter (Signed)
Pharmacy was called. Quantity for Xanax changed to 45 Tablets.

## 2018-05-20 DIAGNOSIS — Z79899 Other long term (current) drug therapy: Secondary | ICD-10-CM | POA: Diagnosis not present

## 2018-05-21 LAB — COMPREHENSIVE METABOLIC PANEL
ALT: 8 IU/L (ref 0–32)
AST: 12 IU/L (ref 0–40)
Albumin/Globulin Ratio: 1.6 (ref 1.2–2.2)
Albumin: 4.1 g/dL (ref 3.5–5.5)
Alkaline Phosphatase: 74 IU/L (ref 39–117)
BILIRUBIN TOTAL: 0.3 mg/dL (ref 0.0–1.2)
BUN / CREAT RATIO: 18 (ref 9–23)
BUN: 14 mg/dL (ref 6–24)
CHLORIDE: 101 mmol/L (ref 96–106)
CO2: 24 mmol/L (ref 20–29)
Calcium: 8.9 mg/dL (ref 8.7–10.2)
Creatinine, Ser: 0.78 mg/dL (ref 0.57–1.00)
GFR, EST AFRICAN AMERICAN: 103 mL/min/{1.73_m2} (ref >59)
GFR, EST NON AFRICAN AMERICAN: 89 mL/min/{1.73_m2} (ref >59)
GLOBULIN, TOTAL: 2.5 g/dL (ref 1.5–4.5)
Glucose: 81 mg/dL (ref 65–99)
POTASSIUM: 4.3 mmol/L (ref 3.5–5.2)
Sodium: 142 mmol/L (ref 134–144)
Total Protein: 6.6 g/dL (ref 6.0–8.5)

## 2018-05-21 LAB — CBC WITH DIFFERENTIAL/PLATELET
Basophils Absolute: 0 10*3/uL (ref 0.0–0.2)
Basos: 0 %
EOS (ABSOLUTE): 0.1 10*3/uL (ref 0.0–0.4)
EOS: 2 %
Hematocrit: 40.6 % (ref 34.0–46.6)
Hemoglobin: 13.5 g/dL (ref 11.1–15.9)
Immature Grans (Abs): 0 10*3/uL (ref 0.0–0.1)
Immature Granulocytes: 0 %
LYMPHS ABS: 3.4 10*3/uL — AB (ref 0.7–3.1)
Lymphs: 42 %
MCH: 30.8 pg (ref 26.6–33.0)
MCHC: 33.3 g/dL (ref 31.5–35.7)
MCV: 93 fL (ref 79–97)
Monocytes Absolute: 0.6 10*3/uL (ref 0.1–0.9)
Monocytes: 7 %
NEUTROS ABS: 3.9 10*3/uL (ref 1.4–7.0)
Neutrophils: 49 %
PLATELETS: 216 10*3/uL (ref 150–450)
RBC: 4.39 x10E6/uL (ref 3.77–5.28)
RDW: 13 % (ref 12.3–15.4)
WBC: 8 10*3/uL (ref 3.4–10.8)

## 2018-05-21 LAB — VALPROIC ACID LEVEL: Valproic Acid Lvl: 53 ug/mL (ref 50–100)

## 2018-06-01 ENCOUNTER — Ambulatory Visit: Payer: Commercial Managed Care - PPO | Admitting: Family Medicine

## 2018-07-13 ENCOUNTER — Encounter: Payer: Self-pay | Admitting: Nurse Practitioner

## 2018-07-13 ENCOUNTER — Ambulatory Visit (INDEPENDENT_AMBULATORY_CARE_PROVIDER_SITE_OTHER): Payer: Commercial Managed Care - PPO | Admitting: Nurse Practitioner

## 2018-07-13 VITALS — BP 110/68 | HR 83 | Temp 98.3°F | Resp 14 | Ht 66.0 in | Wt 187.3 lb

## 2018-07-13 DIAGNOSIS — Z1239 Encounter for other screening for malignant neoplasm of breast: Secondary | ICD-10-CM | POA: Diagnosis not present

## 2018-07-13 DIAGNOSIS — J069 Acute upper respiratory infection, unspecified: Secondary | ICD-10-CM

## 2018-07-13 DIAGNOSIS — Z1211 Encounter for screening for malignant neoplasm of colon: Secondary | ICD-10-CM | POA: Diagnosis not present

## 2018-07-13 DIAGNOSIS — I700 Atherosclerosis of aorta: Secondary | ICD-10-CM

## 2018-07-13 DIAGNOSIS — F419 Anxiety disorder, unspecified: Secondary | ICD-10-CM

## 2018-07-13 DIAGNOSIS — R05 Cough: Secondary | ICD-10-CM | POA: Diagnosis not present

## 2018-07-13 DIAGNOSIS — R0981 Nasal congestion: Secondary | ICD-10-CM

## 2018-07-13 DIAGNOSIS — R059 Cough, unspecified: Secondary | ICD-10-CM

## 2018-07-13 DIAGNOSIS — F3162 Bipolar disorder, current episode mixed, moderate: Secondary | ICD-10-CM

## 2018-07-13 DIAGNOSIS — Z79899 Other long term (current) drug therapy: Secondary | ICD-10-CM

## 2018-07-13 MED ORDER — ATORVASTATIN CALCIUM 40 MG PO TABS: 40.0000 mg | ORAL_TABLET | Freq: Every day | ORAL | 1 refills | Status: DC

## 2018-07-13 MED ORDER — DM-GUAIFENESIN ER 30-600 MG PO TB12: 1.0000 | ORAL_TABLET | Freq: Two times a day (BID) | ORAL | 0 refills | Status: DC

## 2018-07-13 MED ORDER — CETIRIZINE HCL 10 MG PO TABS: 10.0000 mg | ORAL_TABLET | Freq: Every day | ORAL | 0 refills | Status: DC

## 2018-07-13 MED ORDER — BENZONATATE 100 MG PO CAPS: 200.0000 mg | ORAL_CAPSULE | Freq: Three times a day (TID) | ORAL | 0 refills | Status: DC | PRN
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
## 2018-07-13 NOTE — Patient Instructions (Addendum)
You likely have a viral upper respiratory infection (URI). Antibiotics will not reduce the number of days you are ill or prevent you from getting bacterial rhinosinusitis. A URI can take up to 14 days to resolve, but typically last between 7-11 days. Your body is so smart and strong that it will be fighting this illness off for you but it is important that you drink plenty of fluids, rest. Cover your nose/mouth when you cough or sneeze and wash your hands well and often. Here are some helpful things you can use or pick up over the counter from the pharmacy to help with your symptoms:   For Fever/Pain: Acetaminophen every 6 hours as needed (maximum of 3000mg  a day). If you are still uncomfortable you can add ibuprofen OR naproxen  For coughing: try dextromethorphan for a cough suppressant, and/or a cool mist humidifier, lozenges  For sore throat: saline gargles, honey herbal tea, lozenges, throat spray  To dry out your nose: try an antihistamine like loratadine (non-sedating) or diphenhydramine (sedating) or others To relieve a stuffy nose: try flonase, neti pot To make blowing your nose easier and relieve chest congestion: guaifenesin 400mg  every 4-6 hours of guaifenesin ER [phone] mg every 12 hours. Do not take more than 2,400mg  a day.    Please do call to schedule your mammogram; the number to schedule one at either [facility] or [facility] is [phone]  - You should receive a phone call about a colonoscopy within the next month; if you don't please let us know.    Bad cholesterol, also called low-density lipoprotein (LDL), carries cholesterol and other fats that your liver makes to your body tissue. If it builds up in blood vessels, LDL can cause heart disease and other health problems. Your LDL level should be below 100. If you have diabetes or atherosclerosis, your LDL should be below 70.  Eat: Eat 20 to 30 grams of soluble fiber every day. Foods such as fruits  and vegetables, whole grains, beans, peas, nuts, and seeds can help lower LDL. Avoid: Saturated fats (Dairy foods - such as butter, cream, ghee, regular-fat milk and cheese. Meat - such as fatty cuts of beef, pork and lamb, processed meats like salami, sausages and the skin on chicken. Lard., fatty snack foods, cakes, biscuits, pies and deep fried foods) Avoid smoking

## 2018-07-15 ENCOUNTER — Telehealth: Payer: Self-pay | Admitting: Gastroenterology

## 2018-07-15 NOTE — Telephone Encounter (Signed)
Patient returning call to [facility] to schedule a procedure.

## 2018-07-15 NOTE — Telephone Encounter (Signed)
LVM for pt to call office.  Thanks [employer]

## 2018-07-18 ENCOUNTER — Other Ambulatory Visit: Payer: Self-pay

## 2018-07-18 ENCOUNTER — Telehealth: Payer: Self-pay

## 2018-07-18 DIAGNOSIS — Z1211 Encounter for screening for malignant neoplasm of colon: Secondary | ICD-10-CM

## 2018-07-18 NOTE — Telephone Encounter (Signed)
Gastroenterology Pre-Procedure Review  Request Date: 08/01/18 Requesting Physician: Dr. Vicente Males  PATIENT REVIEW QUESTIONS: The patient responded to the following health history questions as indicated:    1. Are you having any GI issues? no 2. Do you have a personal history of Polyps? no 3. Do you have a family history of Colon Cancer or Polyps? no 4. Diabetes Mellitus? no 5. Joint replacements in the past 12 months?no 6. Major health problems in the past 3 months?no 7. Any artificial heart valves, MVP, or defibrillator?no    MEDICATIONS & ALLERGIES:    Patient reports the following regarding taking any anticoagulation/antiplatelet therapy:   Plavix, Coumadin, Eliquis, Xarelto, Lovenox, Pradaxa, Brilinta, or Effient? no Aspirin? no  Patient confirms/reports the following medications:  Current Outpatient Medications  Medication Sig Dispense Refill  . alprazolam (XANAX) 2 MG tablet Take 0.5 tablets (1 mg total) by mouth daily as needed (1/2 tab as needed). 45 tablet 1  . atorvastatin (LIPITOR) 40 MG tablet Take 1 tablet (40 mg total) by mouth daily. 90 tablet 1  . benzonatate (TESSALON PERLES) 100 MG capsule Take 2 capsules (200 mg total) by mouth 3 (three) times daily as needed for cough. 30 capsule 0  . cetirizine (ZYRTEC) 10 MG tablet Take 1 tablet (10 mg total) by mouth daily. 30 tablet 0  . dextromethorphan-guaiFENesin (MUCINEX DM) 30-600 MG 12hr tablet Take 1 tablet by mouth 2 (two) times daily. 30 tablet 0  . divalproex (DEPAKOTE ER) 500 MG 24 hr tablet Take 2 tablets (1,000 mg total) by mouth daily. PM 180 tablet 1  . EUCRISA 2 % OINT     . lamoTRIgine (LAMICTAL) 150 MG tablet Take 1 tablet (150 mg total) by mouth at bedtime. 90 tablet 1  . Multiple Vitamin (MULTI-VITAMINS) TABS Take 1 tablet daily by mouth.    . propranolol (INDERAL) 20 MG tablet Take 1 tablet (20 mg total) by mouth 2 (two) times daily. (Patient not taking: Reported on 07/13/2018) 180 tablet 1  . tildrakizumab-asmn  (ILUMYA) 100 MG/ML subcutaneous injection Inject 100 mg into the skin every 3 (three) months.     No current facility-administered medications for this visit.     Patient confirms/reports the following allergies:  Allergies  Allergen Reactions  . Chloraprep One Step [Chlorhexidine Gluconate] Rash    Used before breast bx    No orders of the defined types were placed in this encounter.   AUTHORIZATION INFORMATION Primary Insurance: 1D#: Group #:  Secondary Insurance: 1D#: Group #:  SCHEDULE INFORMATION: Date: 08/01/18 Time: Location:[facility]

## 2018-07-22 ENCOUNTER — Telehealth: Payer: Self-pay | Admitting: Family Medicine

## 2018-07-22 DIAGNOSIS — R059 Cough, unspecified: Secondary | ICD-10-CM

## 2018-07-22 DIAGNOSIS — R05 Cough: Secondary | ICD-10-CM

## 2018-07-22 MED ORDER — PREDNISONE 10 MG (21) PO TBPK: ORAL_TABLET | ORAL | 0 refills | Status: DC

## 2018-07-22 NOTE — Telephone Encounter (Signed)
Patient was informed and said thanks. 

## 2018-07-22 NOTE — Telephone Encounter (Signed)
Sending prednisone rx to help increase coughing likely caused by bronchitis- please drink plenty of water and eat healthy foods as this medicine can increase  hunger which can cause increased blood sugar and change moods. Please take it as prescribed. Continue taking mucinex, use humidifier to help break up mucous and congestion. Use cephacol and honey-lemon teas to help soothe throat from increase coughing. Please let us know if not improving.

## 2018-07-22 NOTE — Telephone Encounter (Signed)
Please advise 

## 2018-07-22 NOTE — Telephone Encounter (Signed)
Copied from [place] [id number]. Topic: Quick Communication - Rx Refill/Question >> Jul 22, 2018 10:39 AM Scherrie Gerlach wrote: Medication: a stronger cough med Pt seen Benjamine Mola 2/26.  Pt states she is better, but has a terrible ongoing cough.  Pt coughed the whole time we were talking.  Pt could hardly talk from coughing so much. Pt is hoping to get a stronger cough med. The pearls not working  Pt states she missed work all last week.    [id number] [address], [place] (Phone) [phone] (Fax)

## 2018-07-28 DIAGNOSIS — M359 Systemic involvement of connective tissue, unspecified: Secondary | ICD-10-CM | POA: Diagnosis not present

## 2018-08-01 ENCOUNTER — Encounter: Payer: Self-pay | Admitting: Gastroenterology

## 2018-08-01 ENCOUNTER — Ambulatory Visit: Payer: Commercial Managed Care - PPO | Admitting: Certified Registered"

## 2018-08-01 ENCOUNTER — Ambulatory Visit
Admission: RE | Admit: 2018-08-01 | Discharge: 2018-08-01 | Disposition: A | Discharge status: Home / Self Care | Payer: Commercial Managed Care - PPO | Attending: Gastroenterology | Admitting: Gastroenterology

## 2018-08-01 ENCOUNTER — Encounter
Admission: RE | Disposition: A | Discharge status: Home / Self Care | Payer: Self-pay | Source: Home / Self Care | Attending: Gastroenterology

## 2018-08-01 ENCOUNTER — Other Ambulatory Visit: Payer: Self-pay

## 2018-08-01 DIAGNOSIS — F17210 Nicotine dependence, cigarettes, uncomplicated: Secondary | ICD-10-CM | POA: Insufficient documentation

## 2018-08-01 DIAGNOSIS — Z79899 Other long term (current) drug therapy: Secondary | ICD-10-CM | POA: Diagnosis not present

## 2018-08-01 DIAGNOSIS — D122 Benign neoplasm of ascending colon: Secondary | ICD-10-CM | POA: Insufficient documentation

## 2018-08-01 DIAGNOSIS — L400 Psoriasis vulgaris: Secondary | ICD-10-CM | POA: Diagnosis not present

## 2018-08-01 DIAGNOSIS — Z800 Family history of malignant neoplasm of digestive organs: Secondary | ICD-10-CM | POA: Diagnosis not present

## 2018-08-01 DIAGNOSIS — D120 Benign neoplasm of cecum: Secondary | ICD-10-CM | POA: Insufficient documentation

## 2018-08-01 DIAGNOSIS — K635 Polyp of colon: Secondary | ICD-10-CM | POA: Diagnosis not present

## 2018-08-01 DIAGNOSIS — D125 Benign neoplasm of sigmoid colon: Secondary | ICD-10-CM | POA: Insufficient documentation

## 2018-08-01 DIAGNOSIS — Z1211 Encounter for screening for malignant neoplasm of colon: Secondary | ICD-10-CM | POA: Diagnosis not present

## 2018-08-01 DIAGNOSIS — F419 Anxiety disorder, unspecified: Secondary | ICD-10-CM | POA: Insufficient documentation

## 2018-08-01 DIAGNOSIS — E785 Hyperlipidemia, unspecified: Secondary | ICD-10-CM | POA: Insufficient documentation

## 2018-08-01 DIAGNOSIS — D123 Benign neoplasm of transverse colon: Secondary | ICD-10-CM | POA: Insufficient documentation

## 2018-08-01 DIAGNOSIS — K219 Gastro-esophageal reflux disease without esophagitis: Secondary | ICD-10-CM | POA: Diagnosis not present

## 2018-08-01 DIAGNOSIS — F319 Bipolar disorder, unspecified: Secondary | ICD-10-CM | POA: Insufficient documentation

## 2018-08-01 DIAGNOSIS — D126 Benign neoplasm of colon, unspecified: Secondary | ICD-10-CM | POA: Diagnosis not present

## 2018-08-01 HISTORY — PX: COLONOSCOPY WITH PROPOFOL: SHX5780

## 2018-08-01 SURGERY — COLONOSCOPY WITH PROPOFOL
Anesthesia: General

## 2018-08-01 MED ORDER — PROPOFOL 10 MG/ML IV BOLUS
INTRAVENOUS | Status: DC | PRN
  Administered 2018-08-01: 20 mg via INTRAVENOUS
  Administered 2018-08-01: 40 mg via INTRAVENOUS
  Administered 2018-08-01 (×4): 20 mg via INTRAVENOUS
  Administered 2018-08-01: 50 mg via INTRAVENOUS
  Administered 2018-08-01 (×2): 20 mg via INTRAVENOUS
  Administered 2018-08-01: 40 mg via INTRAVENOUS
  Administered 2018-08-01 (×2): 50 mg via INTRAVENOUS
  Administered 2018-08-01: 20 mg via INTRAVENOUS

## 2018-08-01 MED ORDER — SODIUM CHLORIDE 0.9 % IV SOLN
INTRAVENOUS | Status: DC
  Administered 2018-08-01: 1000 mL via INTRAVENOUS
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
## 2018-08-01 NOTE — Transfer of Care (Signed)
Immediate Anesthesia Transfer of Care Note  Patient: Alicia Long  Procedure(s) Performed: COLONOSCOPY WITH PROPOFOL (N/A )  Patient Location: Endoscopy Unit  Anesthesia Type:General  Level of Consciousness: awake, alert , oriented and patient cooperative  Airway & Oxygen Therapy: Patient Spontanous Breathing and Patient connected to face mask oxygen  Post-op Assessment: Report given to RN and Post -op Vital signs reviewed and stable  Post vital signs: Reviewed and stable  Last Vitals:  Vitals Value Taken Time  BP 76/45 08/01/2018 10:53 AM  Temp 36.7 C 08/01/2018 10:51 AM  Pulse 73 08/01/2018 10:55 AM  Resp 19 08/01/2018 10:55 AM  SpO2 100 % 08/01/2018 10:55 AM  Vitals shown include unvalidated device data.  Last Pain:  Vitals:   08/01/18 1051  TempSrc: Tympanic  PainSc: 0-No pain         Complications: No apparent anesthesia complications

## 2018-08-01 NOTE — Anesthesia Postprocedure Evaluation (Signed)
Anesthesia Post Note  Patient: Alicia Long  Procedure(s) Performed: COLONOSCOPY WITH PROPOFOL (N/A )  Patient location during evaluation: PACU Anesthesia Type: General Level of consciousness: awake and alert Pain management: pain level controlled Vital Signs Assessment: post-procedure vital signs reviewed and stable Respiratory status: spontaneous breathing, nonlabored ventilation and respiratory function stable Cardiovascular status: blood pressure returned to baseline and stable Postop Assessment: no apparent nausea or vomiting Anesthetic complications: no     Last Vitals:  Vitals:   08/01/18 1111 08/01/18 1121  BP: 115/81 109/65  Pulse: 71 66  Resp: 19 20  Temp:    SpO2: 100% 100%    Last Pain:  Vitals:   08/01/18 1121  TempSrc:   PainSc: 0-No pain                 Durenda Hurt

## 2018-08-01 NOTE — Anesthesia Post-op Follow-up Note (Signed)
Anesthesia QCDR form completed.        
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
## 2018-08-01 NOTE — Op Note (Signed)
[facility] Gastroenterology Patient Name: Alicia Long Procedure Date: 08/01/2018 9:38 AM MRN: [id number] Account #: [id number] Date of Birth: 1965-10-15 Admit Type: Outpatient Age: 53 Room: [facility] ENDO ROOM 4 Gender: Female Note Status: Finalized Procedure:            Colonoscopy Indications:          Screening for colorectal malignant neoplasm Providers:            Jonathon Bellows MD, MD Referring MD:         Bethena Roys. Sowles, MD (Referring MD) Medicines:            Monitored Anesthesia Care Complications:        No immediate complications. Procedure:            Pre-Anesthesia Assessment:                       - Prior to the procedure, a History and Physical was                        performed, and patient medications, allergies and                        sensitivities were reviewed. The patient's tolerance of                        previous anesthesia was reviewed.                       - The risks and benefits of the procedure and the                        sedation options and risks were discussed with the                        patient. All questions were answered and informed                        consent was obtained.                       - ASA Grade Assessment: II - A patient with mild                        systemic disease.                       After obtaining informed consent, the colonoscope was                        passed under direct vision. Throughout the procedure,                        the patient's blood pressure, pulse, and oxygen                        saturations were monitored continuously. The                        Colonoscope was introduced through the anus and  advanced to the the cecum, identified by the                        appendiceal orifice, IC valve and transillumination.                        The colonoscopy was performed with ease. The patient                        tolerated the procedure well. The  quality of the bowel                        preparation was good. Findings:      Three sessile polyps were found in the ascending colon. The polyps were       4 to 10 mm in size. These polyps were removed with a cold snare.       Resection and retrieval were complete.      A 5 mm polyp was found in the transverse colon. The polyp was sessile.       The polyp was removed with a cold snare. Resection and retrieval were       complete.      A 7 mm polyp was found in the sigmoid colon. The polyp was       semi-pedunculated. The polyp was removed with a hot snare. Resection and       retrieval were complete.      The exam was otherwise without abnormality on direct and retroflexion       views.      A 6 mm polyp was found in the cecum. The polyp was sessile. The polyp       was removed with a cold snare. Resection and retrieval were complete. Impression:           - Three 4 to 10 mm polyps in the ascending colon,                        removed with a cold snare. Resected and retrieved.                       - One 5 mm polyp in the transverse colon, removed with                        a cold snare. Resected and retrieved.                       - One 7 mm polyp in the sigmoid colon, removed with a                        hot snare. Resected and retrieved.                       - The examination was otherwise normal on direct and                        retroflexion views. Recommendation:       - Discharge patient to home (with escort).                       - Resume previous diet.                       -  Continue present medications.                       - Await pathology results.                       - Repeat colonoscopy for surveillance based on                        pathology results. Procedure Code(s):    --- Professional ---                       [phone], Colonoscopy, flexible; with removal of tumor(s),                        polyp(s), or other lesion(s) by snare technique CPT copyright  2018 American Medical Association. All rights reserved. The codes documented in this report are preliminary and upon coder review may  be revised to meet current compliance requirements. Jonathon Bellows, MD Jonathon Bellows MD, MD 08/01/2018 10:54:41 AM This report has been signed electronically. Number of Addenda: 0 Note Initiated On: 08/01/2018 9:38 AM Scope Withdrawal Time: 0 hours 20 minutes 9 seconds  Total Procedure Duration: 0 hours 25 minutes 23 seconds       [facility]

## 2018-08-02 ENCOUNTER — Other Ambulatory Visit: Payer: Self-pay

## 2018-08-02 ENCOUNTER — Encounter: Payer: Self-pay | Admitting: Family Medicine

## 2018-08-02 ENCOUNTER — Ambulatory Visit: Payer: Self-pay

## 2018-08-02 ENCOUNTER — Ambulatory Visit (INDEPENDENT_AMBULATORY_CARE_PROVIDER_SITE_OTHER): Payer: Commercial Managed Care - PPO | Admitting: Family Medicine

## 2018-08-02 ENCOUNTER — Ambulatory Visit
Admission: RE | Admit: 2018-08-02 | Discharge: 2018-08-02 | Disposition: A | Discharge status: Home / Self Care | Payer: Commercial Managed Care - PPO | Attending: Family Medicine | Admitting: Family Medicine

## 2018-08-02 ENCOUNTER — Ambulatory Visit
Admission: RE | Admit: 2018-08-02 | Discharge: 2018-08-02 | Disposition: A | Discharge status: Home / Self Care | Payer: Commercial Managed Care - PPO | Source: Ambulatory Visit | Attending: Family Medicine | Admitting: Family Medicine

## 2018-08-02 VITALS — BP 114/72 | HR 93 | Temp 97.9°F | Resp 22 | Ht 66.0 in | Wt 188.0 lb

## 2018-08-02 DIAGNOSIS — R05 Cough: Secondary | ICD-10-CM

## 2018-08-02 DIAGNOSIS — Z23 Encounter for immunization: Secondary | ICD-10-CM

## 2018-08-02 DIAGNOSIS — R053 Chronic cough: Secondary | ICD-10-CM

## 2018-08-02 DIAGNOSIS — R509 Fever, unspecified: Secondary | ICD-10-CM | POA: Diagnosis not present

## 2018-08-02 LAB — POCT RAPID STREP A (OFFICE): Rapid Strep A Screen: NEGATIVE

## 2018-08-02 LAB — POCT INFLUENZA A/B
Influenza A, POC: NEGATIVE
Influenza B, POC: NEGATIVE

## 2018-08-02 LAB — SURGICAL PATHOLOGY

## 2018-08-02 MED ORDER — CEFTRIAXONE SODIUM 1 G IJ SOLR
1.0000 g | Freq: Once | INTRAMUSCULAR | Status: AC
  Administered 2018-08-02: 1 g via INTRAMUSCULAR

## 2018-08-02 MED ORDER — AZITHROMYCIN 250 MG PO TABS: ORAL_TABLET | ORAL | 0 refills | Status: DC

## 2018-08-02 MED ORDER — UMECLIDINIUM-VILANTEROL 62.5-25 MCG/INH IN AEPB: 1.0000 | INHALATION_SPRAY | Freq: Every day | RESPIRATORY_TRACT | 0 refills | Status: DC

## 2018-08-02 NOTE — Telephone Encounter (Signed)
Pt c/o dry cough since 07/06/18. Fever to 101.8 within the past week and today. Pt c/o "non stop coughing." Pt stated it was hard to catch breath due to non stop coughing. No travel international or [place] no known exposure to pt with Covid 19.  Pt has tried tessalon perels, lozenges, steroids, Robitussin DM, humidifier, honey at bedtime.  Appt made with PCP this afternoon.  Reason for Disposition . Fever present > 3 days (72 hours)  Answer Assessment - Initial Assessment Questions 1. ONSET: "When did the cough begin?"      07/06/18 2. SEVERITY: "How bad is the cough today?"      Non stop coughing 3. RESPIRATORY DISTRESS: "Describe your breathing."      Hard to catch breath due to frequent 4. FEVER: "Do you have a fever?" If so, ask: "What is your temperature, how was it measured, and when did it start?"      Yes 101.8 5. HEMOPTYSIS: "Are you coughing up any blood?" If so ask: "How much?" (flecks, streaks, tablespoons, etc.)     no 6. TREATMENT: "What have you done so far to treat the cough?" (e.g., meds, fluids, humidifier)     Tessalon perels lozenges prescription steroid pack finished last week Robitussin DM humidifier 7. CARDIAC HISTORY: "Do you have any history of heart disease?" (e.g., heart attack, congestive heart failure)      No  8. LUNG HISTORY: "Do you have any history of lung disease?"  (e.g., pulmonary embolus, asthma, emphysema)     no 9. PE RISK FACTORS: "Do you have a history of blood clots?" (or: recent major surgery, recent prolonged travel, bedridden)     no 10. OTHER SYMPTOMS: "Do you have any other symptoms? (e.g., runny nose, wheezing, chest pain)       No,  11. PREGNANCY: "Is there any chance you are pregnant?" "When was your last menstrual period?"       N/a N/a 12. TRAVEL: "Have you traveled out of the country in the last month?" (e.g., travel history, exposures)       No international travel or [place] high risk zones  Protocols used: COUGH - ACUTE  NON-PRODUCTIVE-A-AH
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
## 2018-08-03 LAB — CBC WITH DIFFERENTIAL/PLATELET
Absolute Monocytes: 832 cells/uL (ref 200–950)
Basophils Absolute: 46 cells/uL (ref 0–200)
Basophils Relative: 0.4 %
EOS ABS: 148 {cells}/uL (ref 15–500)
Eosinophils Relative: 1.3 %
HEMATOCRIT: 41.2 % (ref 35.0–45.0)
Hemoglobin: 14.1 g/dL (ref 11.7–15.5)
Lymphs Abs: 4514 cells/uL — ABNORMAL HIGH (ref 850–3900)
MCH: 31.4 pg (ref 27.0–33.0)
MCHC: 34.2 g/dL (ref 32.0–36.0)
MCV: 91.8 fL (ref 80.0–100.0)
MPV: 11.8 fL (ref 7.5–12.5)
Monocytes Relative: 7.3 %
Neutro Abs: 5860 cells/uL (ref 1500–7800)
Neutrophils Relative %: 51.4 %
Platelets: 262 10*3/uL (ref 140–400)
RBC: 4.49 10*6/uL (ref 3.80–5.10)
RDW: 12.6 % (ref 11.0–15.0)
Total Lymphocyte: 39.6 %
WBC: 11.4 10*3/uL — ABNORMAL HIGH (ref 3.8–10.8)

## 2018-08-03 LAB — BASIC METABOLIC PANEL WITH GFR
BUN: 13 mg/dL (ref 7–25)
CO2: 24 mmol/L (ref 20–32)
Calcium: 9.2 mg/dL (ref 8.6–10.4)
Chloride: 106 mmol/L (ref 98–110)
Creat: 0.81 mg/dL (ref 0.50–1.05)
GFR, Est African American: 98 mL/min/{1.73_m2} (ref >=60)
GFR, Est Non African American: 85 mL/min/{1.73_m2} (ref >=60)
GLUCOSE: 83 mg/dL (ref 65–99)
Potassium: 4 mmol/L (ref 3.5–5.3)
Sodium: 140 mmol/L (ref 135–146)

## 2018-08-04 ENCOUNTER — Other Ambulatory Visit: Payer: Self-pay | Admitting: Nurse Practitioner

## 2018-08-04 DIAGNOSIS — R0981 Nasal congestion: Secondary | ICD-10-CM

## 2018-08-04 DIAGNOSIS — R059 Cough, unspecified: Secondary | ICD-10-CM

## 2018-08-04 DIAGNOSIS — R05 Cough: Secondary | ICD-10-CM

## 2018-08-05 ENCOUNTER — Encounter: Payer: Self-pay | Admitting: Family Medicine

## 2018-08-05 ENCOUNTER — Encounter: Payer: Self-pay | Admitting: Gastroenterology

## 2018-08-05 ENCOUNTER — Ambulatory Visit: Payer: Commercial Managed Care - PPO | Admitting: Family Medicine

## 2018-08-05 ENCOUNTER — Other Ambulatory Visit: Payer: Self-pay

## 2018-08-05 VITALS — BP 100/70 | HR 75 | Temp 98.2°F | Resp 16 | Ht 66.0 in | Wt 186.0 lb

## 2018-08-05 DIAGNOSIS — D72829 Elevated white blood cell count, unspecified: Secondary | ICD-10-CM

## 2018-08-05 DIAGNOSIS — R05 Cough: Secondary | ICD-10-CM | POA: Diagnosis not present

## 2018-08-05 DIAGNOSIS — R053 Chronic cough: Secondary | ICD-10-CM

## 2018-08-05 MED ORDER — PROMETHAZINE-CODEINE 6.25-10 MG/5ML PO SOLN: 5.0000 mL | Freq: Every day | ORAL | 0 refills | Status: AC

## 2018-08-05 NOTE — Progress Notes (Signed)
Name: Alicia Long   MRN: [id number]    DOB: Jul 04, 1965   Date:08/05/2018       Progress Note  Subjective  Chief Complaint  Chief Complaint  Patient presents with  . Follow-up    3 days follow up   . Cough    States she feels much better symptoms resolved with antibiotic and inhaler     HPI  Cough: she is here today for a 3 day follow up, she is feeling much better. She states cough during the day and sense well being started within 48 hours of Zpack and one dose of Rocephin. She still has a cough at night and would like something for that today. No fever or chills. Appetite is good, she is down to 5 cigarettes daily and is using Anoro as prescribed. No SOB or wheezing. WBC was high, CXR normal   Patient Active Problem List   Diagnosis Date Noted  . Tremor 04/16/2018  . 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 Anxiety 04/16/2018  . Family history of systemic lupus erythematosus 02/21/2018  . Syncope 09/14/2016  . Trigger finger of right thumb 04/25/2015  . Pain of right thumb 03/19/2015  . Bipolar disorder ([place]) 03/19/2015  . Heartburn   . Gastritis   . Effusion of knee 10/26/2014  . Knee strain 10/26/2014  . Family history of malignant neoplasm of breast   . BRCA negative     Social History   Tobacco Use  . Smoking status: Current Every Day Smoker    Packs/day: 0.25    Years: 33.00    Pack years: 8.25    Types: Cigarettes    Start date: 11/16/1983    Last attempt to quit: 02/19/2017    Years since quitting: 1.4  . Smokeless tobacco: Never Used  Substance Use Topics  . Alcohol use: Not Currently    Alcohol/week: 1.0 standard drinks    Types: 1 Standard drinks or equivalent per week    Comment: per year     Current Outpatient Medications:  .  alprazolam (XANAX) 2 MG tablet, Take 0.5 tablets (1 mg total) by mouth daily as needed (1/2 tab as needed)., Disp: 45 tablet, Rfl: 1 .  atorvastatin (LIPITOR) 40 MG tablet, Take 1 tablet (40 mg  total) by mouth daily., Disp: 90 tablet, Rfl: 1 .  azithromycin (ZITHROMAX) 250 MG tablet, Take as directed, Disp: 6 tablet, Rfl: 0 .  cetirizine (ZYRTEC) 10 MG tablet, TAKE 1 TABLET BY MOUTH EVERY DAY, Disp: 30 tablet, Rfl: 11 .  divalproex (DEPAKOTE ER) 500 MG 24 hr tablet, Take 2 tablets (1,000 mg total) by mouth daily. PM, Disp: 180 tablet, Rfl: 1 .  EUCRISA 2 % OINT, , Disp: , Rfl:  .  lamoTRIgine (LAMICTAL) 150 MG tablet, Take 1 tablet (150 mg total) by mouth at bedtime., Disp: 90 tablet, Rfl: 1 .  Multiple Vitamin (MULTI-VITAMINS) TABS, Take 1 tablet daily by mouth., Disp: , Rfl:  .  propranolol (INDERAL) 20 MG tablet, Take 1 tablet (20 mg total) by mouth 2 (two) times daily., Disp: 180 tablet, Rfl: 1 .  tildrakizumab-asmn (ILUMYA) 100 MG/ML subcutaneous injection, Inject 100 mg into the skin every 3 (three) months., Disp: , Rfl:  .  umeclidinium-vilanterol (ANORO ELLIPTA) 62.5-25 MCG/INH AEPB, Inhale 1 puff into the lungs daily., Disp: 60 each, Rfl: 0  Allergies  Allergen Reactions  . Chloraprep One Step [Chlorhexidine Gluconate] Rash    Used before breast bx  ROS  Ten systems reviewed and is negative except as mentioned in HPI   Objective  Vitals:   08/05/18 0938  BP: 100/70  Pulse: 75  Resp: 16  Temp: 98.2 F (36.8 C)  TempSrc: Oral  SpO2: 98%  Weight: 186 lb (84.4 kg)  Height: 5' 6"  (1.676 m)    Body mass index is 30.02 kg/m.    Physical Exam   Constitutional: Patient appears well-developed and well-nourished. Obese No distress.  HEENT: head atraumatic, normocephalic, pupils equal and reactive to light, neck supple, throat within normal limits Cardiovascular: Normal rate, regular rhythm and normal heart sounds.  No murmur heard. No BLE edema. Pulmonary/Chest: Effort normal and breath sounds normal. No respiratory distress. Abdominal: Soft.  There is no tenderness. Psychiatric: Patient has a normal mood and affect. behavior is normal. Judgment and thought  content normal.  Recent Results (from the past 2160 hour(s))  Valproic acid level     Status: None   Collection Time: 05/20/18 10:54 AM  Result Value Ref Range   Valproic Acid Lvl 53 50 - 100 ug/mL    Comment:                                 Detection Limit = 4                            <4 indicates None Detected Toxicity may occur at levels of 100-500. Measurements of free unbound valproic acid may improve the assess- ment of clinical response.   CBC with Differential/Platelet     Status: Abnormal   Collection Time: 05/20/18 10:54 AM  Result Value Ref Range   WBC 8.0 3.4 - 10.8 x10E3/uL   RBC 4.39 3.77 - 5.28 x10E6/uL   Hemoglobin 13.5 11.1 - 15.9 g/dL   Hematocrit 40.6 34.0 - 46.6 %   MCV 93 79 - 97 fL   MCH 30.8 26.6 - 33.0 pg   MCHC 33.3 31.5 - 35.7 g/dL   RDW 13.0 12.3 - 15.4 %    Comment: **Effective May 23, 2018, the RDW pediatric reference**   interval will be removed and the adult reference interval   will be changing to:                             Female 11.7 - 15.4                                                      Female 11.6 - 15.4    Platelets 216 150 - 450 x10E3/uL   Neutrophils 49 Not Estab. %   Lymphs 42 Not Estab. %   Monocytes 7 Not Estab. %   Eos 2 Not Estab. %   Basos 0 Not Estab. %   Neutrophils Absolute 3.9 1.4 - 7.0 x10E3/uL   Lymphocytes Absolute 3.4 (H) 0.7 - 3.1 x10E3/uL   Monocytes Absolute 0.6 0.1 - 0.9 x10E3/uL   EOS (ABSOLUTE) 0.1 0.0 - 0.4 x10E3/uL   Basophils Absolute 0.0 0.0 - 0.2 x10E3/uL   Immature Granulocytes 0 Not Estab. %   Immature Grans (Abs) 0.0 0.0 - 0.1 x10E3/uL  Comprehensive metabolic panel  Status: None   Collection Time: 05/20/18 10:54 AM  Result Value Ref Range   Glucose 81 65 - 99 mg/dL   BUN 14 6 - 24 mg/dL   Creatinine, Ser 0.78 0.57 - 1.00 mg/dL   GFR calc non Af Amer 89 >59 mL/min/1.73   GFR calc Af Amer 103 >59 mL/min/1.73   BUN/Creatinine Ratio 18 9 - 23   Sodium 142 134 - 144 mmol/L   Potassium 4.3  3.5 - 5.2 mmol/L   Chloride 101 96 - 106 mmol/L   CO2 24 20 - 29 mmol/L   Calcium 8.9 8.7 - 10.2 mg/dL   Total Protein 6.6 6.0 - 8.5 g/dL   Albumin 4.1 3.5 - 5.5 g/dL   Globulin, Total 2.5 1.5 - 4.5 g/dL   Albumin/Globulin Ratio 1.6 1.2 - 2.2   Bilirubin Total 0.3 0.0 - 1.2 mg/dL   Alkaline Phosphatase 74 39 - 117 IU/L   AST 12 0 - 40 IU/L   ALT 8 0 - 32 IU/L  Surgical pathology     Status: None   Collection Time: 08/01/18 10:28 AM  Result Value Ref Range   SURGICAL PATHOLOGY      Surgical Pathology CASE: [id number] PATIENT: Corryn Kea Surgical Pathology Report     SPECIMEN SUBMITTED: A. Colon polyp, cecum; cold snare B. Colon polyps x3, ascending; cold snare C. Colon polyp, transverse; cold snare D. Colon polyp, sigmoid; hot snare  CLINICAL HISTORY: None provided  PRE-OPERATIVE DIAGNOSIS: Screening colonoscopy  POST-OPERATIVE DIAGNOSIS: None provided.     DIAGNOSIS: A. COLON POLYP, CECUM; COLD SNARE: - TUBULAR ADENOMA. - NEGATIVE FOR HIGH-GRADE DYSPLASIA AND MALIGNANCY.  B. COLON POLYPS X3, ASCENDING; COLD SNARE: - MULTIPLE FRAGMENTS OF TUBULAR ADENOMAS. - NEGATIVE FOR HIGH-GRADE DYSPLASIA AND MALIGNANCY.  C. COLON POLYP, TRANSVERSE; COLD SNARE: - POLYPOID LESION WITH SUPERFICIAL SERRATED CHANGES. - NEGATIVE FOR DYSPLASIA AND MALIGNANCY.  Comment: Sections display a sessile polyp with superficial serrated changes, which do not extend to the base of the involved crypts. The differential diagnosis includes an early se ssile serrated polyp, or a hyperplastic polyp. There is no evidence of dysplasia.  D. COLON POLYP, SIGMOID; HOT SNARE: - TUBULAR ADENOMA. - NEGATIVE FOR HIGH-GRADE DYSPLASIA AND MALIGNANCY.  GROSS DESCRIPTION: A. Labeled: Cold snare cecum polyp Received: Formalin Tissue fragment(s): 1 Size: 0.7 cm Description: Tan-pink soft tissue fragment Entirely submitted in 1 cassette.  B. Labeled: Cold snare ascending colon polyps  x3 Received: Formalin Tissue fragment(s): Multiple Size: Aggregate, 1.0 x 0.7 x 0.2 cm Description: Tan soft tissue fragments Entirely submitted in 1 cassette.  C. Labeled: Cold snare transverse colon polyp Received: Formalin Tissue fragment(s): 2 Size: 0.6-0.8 cm Description: Tan-pink soft tissue fragments Entirely submitted in 1 cassette.  D. Labeled: Hot snare sigmoid colon polyp Received: Formalin Tissue fragment(s): 1 Size: 0.5 cm Description: Tan-pink soft tissue fragment Entirely submitted in 1 cassette.   Final Diagnosis perfo rmed by Allena Napoleon, MD.   Electronically signed 08/02/2018 12:46:28PM The electronic signature indicates that the named Attending Pathologist has evaluated the specimen  Technical component performed at [facility], [address] [place], [place] [address] Lab: [phone] Dir: Rush Farmer, MD, MMM  Professional component performed at [facility], [facility], [place], [place], [place] [address] Lab: [phone] Dir: Dellia Nims. Rubinas, MD   POCT Influenza A/B     Status: Normal   Collection Time: 08/02/18  2:08 PM  Result Value Ref Range   Influenza A, POC Negative Negative   Influenza  B, POC Negative Negative  CBC with Differential/Platelet     Status: Abnormal   Collection Time: 08/02/18  2:11 PM  Result Value Ref Range   WBC 11.4 (H) 3.8 - 10.8 Thousand/uL   RBC 4.49 3.80 - 5.10 Million/uL   Hemoglobin 14.1 11.7 - 15.5 g/dL   HCT 41.2 35.0 - 45.0 %   MCV 91.8 80.0 - 100.0 fL   MCH 31.4 27.0 - 33.0 pg   MCHC 34.2 32.0 - 36.0 g/dL   RDW 12.6 11.0 - 15.0 %   Platelets 262 140 - 400 Thousand/uL   MPV 11.8 7.5 - 12.5 fL   Neutro Abs 5,860 1,500 - 7,800 cells/uL   Lymphs Abs 4,514 (H) 850 - 3,900 cells/uL   Absolute Monocytes 832 200 - 950 cells/uL   Eosinophils Absolute 148 15 - 500 cells/uL   Basophils Absolute 46 0 - 200 cells/uL   Neutrophils Relative % 51.4 %   Total Lymphocyte 39.6 %   Monocytes Relative  7.3 %   Eosinophils Relative 1.3 %   Basophils Relative 0.4 %  BASIC METABOLIC PANEL WITH GFR     Status: None   Collection Time: 08/02/18  2:11 PM  Result Value Ref Range   Glucose, Bld 83 65 - 99 mg/dL    Comment: .            Fasting reference interval .    BUN 13 7 - 25 mg/dL   Creat 0.81 0.50 - 1.05 mg/dL    Comment: For patients >77 years of age, the reference limit for Creatinine is approximately 13% higher for people identified as African-American. .    GFR, Est Non African American 85 > OR = 60 mL/min/1.66m   GFR, Est African American 98 > OR = 60 mL/min/1.72m  BUN/Creatinine Ratio NOT APPLICABLE 6 - 22 (calc)   Sodium 140 135 - 146 mmol/L   Potassium 4.0 3.5 - 5.3 mmol/L   Chloride 106 98 - 110 mmol/L   CO2 24 20 - 32 mmol/L   Calcium 9.2 8.6 - 10.4 mg/dL  POCT rapid strep A     Status: Normal   Collection Time: 08/02/18  2:27 PM  Result Value Ref Range   Rapid Strep A Screen Negative Negative     Assessment & Plan  1. Persistent cough for 3 weeks or longer  Doing well now, responded within 48 hours of antibiotics. She likely had walking pneumonia, but normal CXR, discussed repeating CBC but she would like to hold off since she is doing so much better  - Promethazine-Codeine 6.25-10 MG/5ML SOLN; Take 5 mLs by mouth at bedtime.  Dispense: 118 mL; Refill: 0  2. Leukocytosis, unspecified type

## 2018-10-17 ENCOUNTER — Encounter: Payer: Commercial Managed Care - PPO | Admitting: Family Medicine

## 2018-10-18 ENCOUNTER — Other Ambulatory Visit: Payer: Self-pay | Admitting: Physician Assistant

## 2018-10-20 ENCOUNTER — Encounter: Payer: Self-pay | Admitting: Physician Assistant

## 2018-10-20 ENCOUNTER — Other Ambulatory Visit: Payer: Self-pay

## 2018-10-20 ENCOUNTER — Ambulatory Visit: Payer: Commercial Managed Care - PPO | Admitting: Physician Assistant

## 2018-10-20 DIAGNOSIS — G4700 Insomnia, unspecified: Secondary | ICD-10-CM | POA: Diagnosis not present

## 2018-10-20 DIAGNOSIS — F411 Generalized anxiety disorder: Secondary | ICD-10-CM | POA: Diagnosis not present

## 2018-10-20 DIAGNOSIS — F319 Bipolar disorder, unspecified: Secondary | ICD-10-CM

## 2018-10-20 DIAGNOSIS — R251 Tremor, unspecified: Secondary | ICD-10-CM

## 2018-10-20 MED ORDER — PROPRANOLOL HCL 20 MG PO TABS: 20.0000 mg | ORAL_TABLET | Freq: Two times a day (BID) | ORAL | 1 refills | Status: DC

## 2018-10-20 MED ORDER — DIAZEPAM 10 MG PO TABS: 10.0000 mg | ORAL_TABLET | Freq: Two times a day (BID) | ORAL | 0 refills | Status: DC | PRN

## 2018-10-20 MED ORDER — LAMOTRIGINE 150 MG PO TABS: 150.0000 mg | ORAL_TABLET | Freq: Every day | ORAL | 1 refills | Status: DC

## 2018-10-20 MED ORDER — DIVALPROEX SODIUM ER 500 MG PO TB24: 1000.0000 mg | ORAL_TABLET | Freq: Every day | ORAL | 1 refills | Status: DC

## 2018-10-20 MED ORDER — QUETIAPINE FUMARATE 25 MG PO TABS: 25.0000 mg | ORAL_TABLET | Freq: Every evening | ORAL | 0 refills | Status: DC | PRN

## 2018-10-20 NOTE — Progress Notes (Signed)
[facility]  Patient ID: Alicia Long,  MRN: [id number]  PCP: Steele Sizer, MD  Date of Evaluation: 10/20/2018 Time spent:25 minutes  Chief Complaint:  Chief Complaint    Anxiety; Depression; Follow-up      HISTORY/CURRENT STATUS: HPI For routine med check.  But not doing well.   A lot of stress.  Her dtr has been in jail twice, once for domestic violence, and once for stealing her MIL's jewelry and pawning it. Pt is filing for custody of her grandson.  Her daughter is on meth and cocaine.   Pt was furloughed for 4 weeks b/c of COVID.  Behind on her bills.  She's worried about her job.   Son is living with her b/c he and GF are 'messed up.'  His GF is pregnant.   Pt and husband had been having trouble but are better now.  Biggest problem is she is having trouble sleeping.  She might get about 4 hours per night.  She took half of the Seroquel, 100 mg, that her daughter had, which did help her sleep.  She is wondering if she could have that.  Feels that the Xanax is not really as effective as she would like for it to be.  It also makes her really tired.  But unable to go to sleep at night with it.  She cannot take it during the day because it makes her too drowsy at work.  She has taken Valium in the past which was helpful and did not cause the sedation.  She would like to go back to that if possible.  Patient denies loss of interest in usual activities and is able to enjoy things.  Denies decreased energy or motivation.  Appetite has not changed.  No extreme sadness, tearfulness, or feelings of hopelessness.  Denies any changes in concentration, making decisions or remembering things.  Denies suicidal or homicidal thoughts.  Patient denies increased energy with decreased need for sleep, no increased talkativeness, no racing thoughts, no impulsivity or risky behaviors, no increased spending, no increased libido, no grandiosity.  Denies dizziness, syncope, seizures,  numbness, tingling, tremor, tics, unsteady gait, slurred speech, confusion.  Tremor is much better since being on the propranolol.  Denies muscle or joint pain, stiffness, or dystonia.  Individual Medical History/ Review of Systems: Changes? :No    Past medications for mental health diagnoses include: Wellbutrin caused tremor, Cymbalta, Prozac, Valium   Allergies: Chloraprep one step [chlorhexidine gluconate]  Current Medications:  Current Outpatient Medications:  .  atorvastatin (LIPITOR) 40 MG tablet, Take 1 tablet (40 mg total) by mouth daily., Disp: 90 tablet, Rfl: 1 .  cetirizine (ZYRTEC) 10 MG tablet, TAKE 1 TABLET BY MOUTH EVERY DAY, Disp: 30 tablet, Rfl: 11 .  divalproex (DEPAKOTE ER) 500 MG 24 hr tablet, Take 2 tablets (1,000 mg total) by mouth daily. PM, Disp: 180 tablet, Rfl: 1 .  EUCRISA 2 % OINT, , Disp: , Rfl:  .  lamoTRIgine (LAMICTAL) 150 MG tablet, Take 1 tablet (150 mg total) by mouth at bedtime., Disp: 90 tablet, Rfl: 1 .  Multiple Vitamin (MULTI-VITAMINS) TABS, Take 1 tablet daily by mouth., Disp: , Rfl:  .  propranolol (INDERAL) 20 MG tablet, Take 1 tablet (20 mg total) by mouth 2 (two) times daily., Disp: 180 tablet, Rfl: 1 .  tildrakizumab-asmn (ILUMYA) 100 MG/ML subcutaneous injection, Inject 100 mg into the skin every 3 (three) months., Disp: , Rfl:  .  azithromycin (ZITHROMAX) 250 MG tablet,  Take as directed (Patient not taking: Reported on 10/20/2018), Disp: 6 tablet, Rfl: 0 .  diazepam (VALIUM) 10 MG tablet, Take 1 tablet (10 mg total) by mouth every 12 (twelve) hours as needed for anxiety., Disp: 60 tablet, Rfl: 0 .  Promethazine-Codeine 6.25-10 MG/5ML SOLN, Take 5 mLs by mouth at bedtime. (Patient not taking: Reported on 10/20/2018), Disp: 118 mL, Rfl: 0 .  QUEtiapine (SEROQUEL) 25 MG tablet, Take 1-2 tablets (25-50 mg total) by mouth at bedtime as needed., Disp: 60 tablet, Rfl: 0 .  umeclidinium-vilanterol (ANORO ELLIPTA) 62.5-25 MCG/INH AEPB, Inhale 1 puff into the  lungs daily. (Patient not taking: Reported on 10/20/2018), Disp: 60 each, Rfl: 0 Medication Side Effects: none  Family Medical/ Social History: Changes? Yes see above  MENTAL HEALTH EXAM:  Last menstrual period 08/16/2008.There is no height or weight on file to calculate BMI.  General Appearance: Casual  Eye Contact:  Good  Speech:  Clear and Coherent  Volume:  Normal  Mood:  Anxious  Affect:  Appropriate  Thought Process:  Goal Directed  Orientation:  Full (Time, Place, and Person)  Thought Content: Logical   Suicidal Thoughts:  No  Homicidal Thoughts:  No  Memory:  WNL  Judgement:  Good  Insight:  Good  Psychomotor Activity:  Normal  Concentration:  Concentration: Good  Recall:  Good  Fund of Knowledge: Good  Language: Good  Assets:  Desire for Improvement  ADL's:  Intact  Cognition: WNL  Prognosis:  Good    DIAGNOSES:    ICD-10-CM   1. Bipolar I disorder ([place]) F31.9   2. Generalized anxiety disorder F41.1   3. Tremor R25.1   4. Insomnia, unspecified type G47.00     Receiving Psychotherapy: No    RECOMMENDATIONS:  I spent 25 minutes with her and 50% of that time was in counseling concerning diagnosis and different treatment options. Discontinue Xanax.  Start Valium 10 mg half to 1 twice daily as needed. Continue Lamictal 150 mg nightly. Continue propranolol 20 mg twice daily. Continue Depakote ER 500 mg, 2 nightly. Start Seroquel 25 mg, 1-2 nightly as needed sleep. Coping techniques discussed.  Sleep hygiene discussed. Return in 4 weeks.  Donnal Moat, PA-C   This record has been created using [employer].  Chart creation errors have been sought, but may not always have been located and corrected. Such creation errors do not reflect on the standard of medical care.

## 2018-11-11 ENCOUNTER — Other Ambulatory Visit: Payer: Self-pay | Admitting: Physician Assistant

## 2018-11-16 ENCOUNTER — Other Ambulatory Visit: Payer: Self-pay | Admitting: Physician Assistant

## 2018-11-16 ENCOUNTER — Telehealth: Payer: Self-pay | Admitting: Physician Assistant

## 2018-11-16 MED ORDER — DIAZEPAM 10 MG PO TABS: 10.0000 mg | ORAL_TABLET | Freq: Two times a day (BID) | ORAL | 2 refills | Status: DC | PRN

## 2018-11-16 NOTE — Telephone Encounter (Signed)
I sent in Valium, 30 day w/ 2 RF.

## 2018-11-16 NOTE — Telephone Encounter (Signed)
Patient was suppose to have an appointment tomorrow 7/2 to follow up on two meds that she was trying. She is doing great on the valium and the seroquel and would like a 90 day supply [facility]. She is scheduled in august 7 th for next appointment

## 2018-11-17 ENCOUNTER — Ambulatory Visit: Payer: Commercial Managed Care - PPO | Admitting: Physician Assistant

## 2018-12-23 ENCOUNTER — Ambulatory Visit (INDEPENDENT_AMBULATORY_CARE_PROVIDER_SITE_OTHER): Payer: Commercial Managed Care - PPO | Admitting: Physician Assistant

## 2018-12-23 ENCOUNTER — Encounter: Payer: Self-pay | Admitting: Physician Assistant

## 2018-12-23 ENCOUNTER — Other Ambulatory Visit: Payer: Self-pay

## 2018-12-23 DIAGNOSIS — F319 Bipolar disorder, unspecified: Secondary | ICD-10-CM

## 2018-12-23 DIAGNOSIS — F411 Generalized anxiety disorder: Secondary | ICD-10-CM | POA: Diagnosis not present

## 2018-12-23 DIAGNOSIS — G4700 Insomnia, unspecified: Secondary | ICD-10-CM | POA: Diagnosis not present

## 2018-12-23 MED ORDER — DIAZEPAM 10 MG PO TABS: 10.0000 mg | ORAL_TABLET | Freq: Three times a day (TID) | ORAL | 2 refills | Status: DC | PRN

## 2018-12-23 MED ORDER — LAMOTRIGINE 100 MG PO TABS: 100.0000 mg | ORAL_TABLET | Freq: Two times a day (BID) | ORAL | 1 refills | Status: DC

## 2018-12-23 NOTE — Progress Notes (Signed)
[facility]  Patient ID: Alicia Long,  MRN: [id number]  PCP: Steele Sizer, MD  Date of Evaluation: 12/23/2018 Time spent:15 minutes  Chief Complaint:  Chief Complaint    Follow-up     Virtual Visit via Telephone Note  I connected with patient by a video enabled telemedicine application or telephone, with their informed consent, and verified patient privacy and that I am speaking with the correct person using two identifiers.  I am private, in my home and the patient is in her car.   I discussed the limitations, risks, security and privacy concerns of performing an evaluation and management service by telephone and the availability of in person appointments. I also discussed with the patient that there may be a patient responsible charge related to this service. The patient expressed understanding and agreed to proceed.   I discussed the assessment and treatment plan with the patient. The patient was provided an opportunity to ask questions and all were answered. The patient agreed with the plan and demonstrated an understanding of the instructions.   The patient was advised to call back or seek an in-person evaluation if the symptoms worsen or if the condition fails to improve as anticipated.  I provided 15 minutes of non-face-to-face time during this encounter.  HISTORY/CURRENT STATUS: HPI For routine med check.  Under a lot of stress.  She has gotten full custody of her 59 yo grandson.  Her 80 yo dtr is on opiates, meth, but she thinks pt should let her live w/ her and pt take care of her.  The granddtr is being raised by her son-in-law, who has partial custody. It's been very stressful.   Also she works at [employer], [place] had voluntarily retirements.  She wasn't able to do that. And now she may be laid off, but it's unknown.  "I'm praying I don't lose my job."  If she wasn't on the Seroquel, states she wouldn't be sleeping at all.  States her husband reports that  she is more depressed and has low energy and motivation.  "I feel like once the custody battle is over with then I will feel a lot better.  And then of course if I know I still have a job.  I have a short fuse too."  Denies suicidal or homicidal thoughts  Patient denies increased energy with decreased need for sleep, no increased talkativeness, no racing thoughts, no impulsivity or risky behaviors, no increased spending, no increased libido, no grandiosity.  Denies dizziness, syncope, seizures, numbness, tingling, tremor, tics, unsteady gait, slurred speech, confusion. Denies muscle or joint pain, stiffness, or dystonia.  Individual Medical History/ Review of Systems: Changes? :No    Past medications for mental health diagnoses include: Wellbutrin caused tremor, Cymbalta, Prozac, Valium, Depakote high dose caused worsening of tremor.   Allergies: Chloraprep one step [chlorhexidine gluconate]  Current Medications:  Current Outpatient Medications:  .  atorvastatin (LIPITOR) 40 MG tablet, Take 1 tablet (40 mg total) by mouth daily., Disp: 90 tablet, Rfl: 1 .  cetirizine (ZYRTEC) 10 MG tablet, TAKE 1 TABLET BY MOUTH EVERY DAY, Disp: 30 tablet, Rfl: 11 .  diazepam (VALIUM) 10 MG tablet, Take 1 tablet (10 mg total) by mouth every 8 (eight) hours as needed for anxiety., Disp: 90 tablet, Rfl: 2 .  divalproex (DEPAKOTE ER) 500 MG 24 hr tablet, Take 2 tablets (1,000 mg total) by mouth daily. PM, Disp: 180 tablet, Rfl: 1 .  EUCRISA 2 % OINT, , Disp: , Rfl:  .  Multiple Vitamin (MULTI-VITAMINS) TABS, Take 1 tablet daily by mouth., Disp: , Rfl:  .  propranolol (INDERAL) 20 MG tablet, Take 1 tablet (20 mg total) by mouth 2 (two) times daily., Disp: 180 tablet, Rfl: 1 .  QUEtiapine (SEROQUEL) 25 MG tablet, TAKE 1-2 TABLETS (25-50 MG TOTAL) BY MOUTH AT BEDTIME AS NEEDED., Disp: 180 tablet, Rfl: 0 .  tildrakizumab-asmn (ILUMYA) 100 MG/ML subcutaneous injection, Inject 100 mg into the skin every 3 (three)  months., Disp: , Rfl:  .  azithromycin (ZITHROMAX) 250 MG tablet, Take as directed (Patient not taking: Reported on 10/20/2018), Disp: 6 tablet, Rfl: 0 .  lamoTRIgine (LAMICTAL) 100 MG tablet, Take 1 tablet (100 mg total) by mouth 2 (two) times daily., Disp: 60 tablet, Rfl: 1 .  Promethazine-Codeine 6.25-10 MG/5ML SOLN, Take 5 mLs by mouth at bedtime. (Patient not taking: Reported on 10/20/2018), Disp: 118 mL, Rfl: 0 .  umeclidinium-vilanterol (ANORO ELLIPTA) 62.5-25 MCG/INH AEPB, Inhale 1 puff into the lungs daily. (Patient not taking: Reported on 10/20/2018), Disp: 60 each, Rfl: 0 Medication Side Effects: none  Family Medical/ Social History: Changes? Yes see above.   MENTAL HEALTH EXAM:  Last menstrual period 08/16/2008.There is no height or weight on file to calculate BMI.  General Appearance: unable to assess  Eye Contact:  unable to assess  Speech:  Clear and Coherent  Volume:  Normal  Mood:  Euthymic  Affect:  unable to assess  Thought Process:  Goal Directed  Orientation:  Full (Time, Place, and Person)  Thought Content: Logical   Suicidal Thoughts:  No  Homicidal Thoughts:  No  Memory:  WNL  Judgement:  Good  Insight:  Good  Psychomotor Activity:  unable to assess  Concentration:  Concentration: Good  Recall:  Good  Fund of Knowledge: Good  Language: Good  Assets:  Desire for Improvement  ADL's:  Intact  Cognition: WNL  Prognosis:  Good    DIAGNOSES:    ICD-10-CM   1. Generalized anxiety disorder  F41.1   2. Bipolar I disorder ([place])  F31.9   3. Insomnia, unspecified type  G47.00     Receiving Psychotherapy: No    RECOMMENDATIONS:  Increase Lamictal to 100 mg p.o. twice daily. Continue Depakote ER 500 mg, 2 nightly.  We discussed the possibility of increasing that to help with the irritability but in the past when we did that the tremor was severe so we had to decrease.  We will leave the same for now. Increase Valium 10 mg 1 every 8 hours as needed. Continue  propranolol 20 mg 1 twice daily. Continue Seroquel 25 mg 1-2 nightly as needed sleep.  We may consider increasing that to help with mood if needed in the future. Return in 4 to 6 weeks.  Donnal Moat, PA-C   This record has been created using [employer].  Chart creation errors have been sought, but may not always have been located and corrected. Such creation errors do not reflect on the standard of medical care.

## 2019-01-01 ENCOUNTER — Other Ambulatory Visit: Payer: Self-pay | Admitting: Nurse Practitioner

## 2019-01-01 DIAGNOSIS — I700 Atherosclerosis of aorta: Secondary | ICD-10-CM

## 2019-01-02 NOTE — Telephone Encounter (Signed)
Please schedule routine 3 months

## 2019-01-03 NOTE — Telephone Encounter (Signed)
lvm to sch appt °

## 2019-01-16 ENCOUNTER — Other Ambulatory Visit: Payer: Self-pay | Admitting: Physician Assistant

## 2019-01-31 ENCOUNTER — Other Ambulatory Visit: Payer: Self-pay | Admitting: Physician Assistant

## 2019-02-03 ENCOUNTER — Other Ambulatory Visit: Payer: Self-pay | Admitting: Physician Assistant

## 2019-02-06 ENCOUNTER — Ambulatory Visit: Payer: Commercial Managed Care - PPO | Admitting: Physician Assistant

## 2019-03-05 ENCOUNTER — Other Ambulatory Visit: Payer: Self-pay | Admitting: Physician Assistant

## 2019-03-09 ENCOUNTER — Ambulatory Visit: Payer: Commercial Managed Care - PPO | Admitting: Physician Assistant

## 2019-03-13 ENCOUNTER — Other Ambulatory Visit: Payer: Self-pay

## 2019-03-13 ENCOUNTER — Encounter: Payer: Self-pay | Admitting: Physician Assistant

## 2019-03-13 ENCOUNTER — Ambulatory Visit (INDEPENDENT_AMBULATORY_CARE_PROVIDER_SITE_OTHER): Payer: Commercial Managed Care - PPO | Admitting: Physician Assistant

## 2019-03-13 VITALS — BP 118/64 | HR 80

## 2019-03-13 DIAGNOSIS — R251 Tremor, unspecified: Secondary | ICD-10-CM

## 2019-03-13 DIAGNOSIS — F319 Bipolar disorder, unspecified: Secondary | ICD-10-CM | POA: Diagnosis not present

## 2019-03-13 DIAGNOSIS — G4700 Insomnia, unspecified: Secondary | ICD-10-CM | POA: Diagnosis not present

## 2019-03-13 DIAGNOSIS — F411 Generalized anxiety disorder: Secondary | ICD-10-CM | POA: Diagnosis not present

## 2019-03-13 NOTE — Progress Notes (Signed)
[facility]  Patient ID: Alicia Long,  MRN: [id number]  PCP: Steele Sizer, MD  Date of Evaluation: 03/13/2019 Time spent:15 minutes  Chief Complaint:  Chief Complaint    Medication Refill      HISTORY/CURRENT STATUS: HPI  Very stressed.  Lost her job 03/01/2019.  That has been devastating.  Is looking for a job. Will lose insurance in a few days. She now has legal custody of her 51 yo grandson. (her daughter's son.) She doesn't know where her daughter is or if she's still on drugs or what.   Feels like the meds are working well. If it wasn't for losing her job, states she'd be doing well.  She wanted to talk to me about this b/c she wants to make sure she can get her meds when needed. Still has a lot of anxiety but Valium helps a lot.  Patient denies loss of interest in usual activities and is able to enjoy things.  Denies decreased energy or motivation.  Appetite has not changed.  No extreme sadness, tearfulness, or feelings of hopelessness.  Denies any changes in concentration, making decisions or remembering things.  Denies suicidal or homicidal thoughts.  Patient denies increased energy with decreased need for sleep, no increased talkativeness, no racing thoughts, no impulsivity or risky behaviors, no increased spending, no increased libido, no grandiosity.  Denies dizziness, syncope, seizures, numbness, tingling, tremor is gone since using the Inderal, tics, unsteady gait, slurred speech, confusion. Denies muscle or joint pain, stiffness, or dystonia.  Individual Medical History/ Review of Systems: Changes? :No    Past medications for mental health diagnoses include: Wellbutrin caused tremor, Cymbalta, Prozac, Valium, Depakote high dose caused worsening of tremor.   Allergies: Chloraprep one step [chlorhexidine gluconate]  Current Medications:  Current Outpatient Medications:  .  atorvastatin (LIPITOR) 40 MG tablet, TAKE 1 TABLET BY MOUTH EVERY DAY, Disp:  90 tablet, Rfl: 1 .  cetirizine (ZYRTEC) 10 MG tablet, TAKE 1 TABLET BY MOUTH EVERY DAY, Disp: 30 tablet, Rfl: 11 .  diazepam (VALIUM) 10 MG tablet, Take 1 tablet (10 mg total) by mouth every 8 (eight) hours as needed for anxiety., Disp: 90 tablet, Rfl: 2 .  divalproex (DEPAKOTE ER) 500 MG 24 hr tablet, Take 2 tablets (1,000 mg total) by mouth daily. PM, Disp: 180 tablet, Rfl: 1 .  lamoTRIgine (LAMICTAL) 100 MG tablet, TAKE 1 TABLET BY MOUTH TWICE A DAY, Disp: 180 tablet, Rfl: 0 .  Multiple Vitamin (MULTI-VITAMINS) TABS, Take 1 tablet daily by mouth., Disp: , Rfl:  .  propranolol (INDERAL) 20 MG tablet, Take 1 tablet (20 mg total) by mouth 2 (two) times daily., Disp: 180 tablet, Rfl: 1 .  QUEtiapine (SEROQUEL) 25 MG tablet, TAKE 1-2 TABLETS (25-50 MG TOTAL) BY MOUTH AT BEDTIME AS NEEDED., Disp: 180 tablet, Rfl: 0 .  umeclidinium-vilanterol (ANORO ELLIPTA) 62.5-25 MCG/INH AEPB, Inhale 1 puff into the lungs daily., Disp: 60 each, Rfl: 0 .  azithromycin (ZITHROMAX) 250 MG tablet, Take as directed (Patient not taking: Reported on 10/20/2018), Disp: 6 tablet, Rfl: 0 .  EUCRISA 2 % OINT, , Disp: , Rfl:  .  Promethazine-Codeine 6.25-10 MG/5ML SOLN, Take 5 mLs by mouth at bedtime. (Patient not taking: Reported on 10/20/2018), Disp: 118 mL, Rfl: 0 .  tildrakizumab-asmn (ILUMYA) 100 MG/ML subcutaneous injection, Inject 100 mg into the skin every 3 (three) months., Disp: , Rfl:  Medication Side Effects: none  Family Medical/ Social History: Changes? Yes Lost her job earlier this month.  Has custody of her grandson.  MENTAL HEALTH EXAM:  Blood pressure 118/64, pulse 80, last menstrual period 08/16/2008.There is no height or weight on file to calculate BMI.  General Appearance: Casual and Well Groomed  Eye Contact:  Good  Speech:  Clear and Coherent  Volume:  Normal  Mood:  Euthymic  Affect:  Appropriate  Thought Process:  Goal Directed and Descriptions of Associations: Intact  Orientation:  Full (Time,  Place, and Person)  Thought Content: Logical   Suicidal Thoughts:  No  Homicidal Thoughts:  No  Memory:  WNL  Judgement:  Good  Insight:  Good  Psychomotor Activity:  Normal  Concentration:  Concentration: Good  Recall:  Good  Fund of Knowledge: Good  Language: Good  Assets:  Desire for Improvement  ADL's:  Intact  Cognition: WNL  Prognosis:  Good    DIAGNOSES:    ICD-10-CM   1. Bipolar I disorder ([place])  F31.9   2. Generalized anxiety disorder  F41.1   3. Insomnia, unspecified type  G47.00   4. Tremor  R25.1     Receiving Psychotherapy: No    RECOMMENDATIONS:  I'm so sorry about her job. I will fill her meds for up to 6 to 9 months as needed.  She may be going through [facility] to get her prescriptions if she has to pay out of pocket.  She will call and let me know when she needs refills and where to send those. Continue Valium 10 mg 1 3 times daily as needed. Continue Depakote ER 500 mg, 2 p.o. nightly. Continue Lamictal 100 mg, 1 p.o. twice daily. Continue propranolol 20 mg 1 twice daily for tremor. Continue Seroquel 25 mg, 1 nightly as needed sleep. Return in 6 months.   Donnal Moat, PA-C

## 2019-05-07 ENCOUNTER — Other Ambulatory Visit: Payer: Self-pay | Admitting: Physician Assistant

## 2019-06-03 ENCOUNTER — Other Ambulatory Visit: Payer: Self-pay | Admitting: Physician Assistant

## 2019-06-05 ENCOUNTER — Other Ambulatory Visit: Payer: Self-pay

## 2019-06-05 DIAGNOSIS — I700 Atherosclerosis of aorta: Secondary | ICD-10-CM

## 2019-06-06 ENCOUNTER — Ambulatory Visit (INDEPENDENT_AMBULATORY_CARE_PROVIDER_SITE_OTHER): Payer: BC Managed Care – PPO | Admitting: Family Medicine

## 2019-06-06 ENCOUNTER — Encounter: Payer: Self-pay | Admitting: Family Medicine

## 2019-06-06 ENCOUNTER — Other Ambulatory Visit: Payer: Self-pay

## 2019-06-06 VITALS — BP 110/80 | HR 85 | Temp 97.7°F | Resp 16 | Ht 66.0 in | Wt 173.9 lb

## 2019-06-06 DIAGNOSIS — I700 Atherosclerosis of aorta: Secondary | ICD-10-CM

## 2019-06-06 DIAGNOSIS — L409 Psoriasis, unspecified: Secondary | ICD-10-CM

## 2019-06-06 DIAGNOSIS — D126 Benign neoplasm of colon, unspecified: Secondary | ICD-10-CM | POA: Diagnosis not present

## 2019-06-06 DIAGNOSIS — F3162 Bipolar disorder, current episode mixed, moderate: Secondary | ICD-10-CM | POA: Diagnosis not present

## 2019-06-06 DIAGNOSIS — N952 Postmenopausal atrophic vaginitis: Secondary | ICD-10-CM

## 2019-06-06 DIAGNOSIS — Z131 Encounter for screening for diabetes mellitus: Secondary | ICD-10-CM

## 2019-06-06 DIAGNOSIS — Z0000 Encounter for general adult medical examination without abnormal findings: Secondary | ICD-10-CM

## 2019-06-06 DIAGNOSIS — Z1159 Encounter for screening for other viral diseases: Secondary | ICD-10-CM

## 2019-06-06 DIAGNOSIS — K635 Polyp of colon: Secondary | ICD-10-CM | POA: Insufficient documentation

## 2019-06-06 DIAGNOSIS — Z124 Encounter for screening for malignant neoplasm of cervix: Secondary | ICD-10-CM

## 2019-06-06 DIAGNOSIS — D3501 Benign neoplasm of right adrenal gland: Secondary | ICD-10-CM | POA: Diagnosis not present

## 2019-06-06 DIAGNOSIS — Z79899 Other long term (current) drug therapy: Secondary | ICD-10-CM

## 2019-06-06 DIAGNOSIS — Z1231 Encounter for screening mammogram for malignant neoplasm of breast: Secondary | ICD-10-CM

## 2019-06-06 MED ORDER — ESTRADIOL 0.1 MG/GM VA CREA: 1.0000 | TOPICAL_CREAM | VAGINAL | 12 refills | Status: DC

## 2019-06-06 MED ORDER — ATORVASTATIN CALCIUM 40 MG PO TABS: 40.0000 mg | ORAL_TABLET | Freq: Every day | ORAL | 3 refills | Status: DC
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
## 2019-06-06 NOTE — Patient Instructions (Signed)

## 2019-06-06 NOTE — Telephone Encounter (Signed)
Pt will be seen this afternoon for cpe and med refill

## 2019-06-08 LAB — QUANTIFERON-TB GOLD PLUS
Mitogen-NIL: >10 IU/mL
NIL: 0.08 IU/mL
QuantiFERON-TB Gold Plus: NEGATIVE
TB1-NIL: 0.03 IU/mL
TB2-NIL: 0.01 IU/mL

## 2019-06-08 LAB — COMPLETE METABOLIC PANEL WITH GFR
AG Ratio: 1.9 (calc) (ref 1.0–2.5)
ALT: 15 U/L (ref 6–29)
AST: 15 U/L (ref 10–35)
Albumin: 4.3 g/dL (ref 3.6–5.1)
Alkaline phosphatase (APISO): 73 U/L (ref 37–153)
BUN: 13 mg/dL (ref 7–25)
CO2: 30 mmol/L (ref 20–32)
Calcium: 9.4 mg/dL (ref 8.6–10.4)
Chloride: 105 mmol/L (ref 98–110)
Creat: 0.76 mg/dL (ref 0.50–1.05)
GFR, Est African American: 105 mL/min/{1.73_m2} (ref >=60)
GFR, Est Non African American: 91 mL/min/{1.73_m2} (ref >=60)
Globulin: 2.3 g/dL (calc) (ref 1.9–3.7)
Glucose, Bld: 107 mg/dL — ABNORMAL HIGH (ref 65–99)
Potassium: 4.8 mmol/L (ref 3.5–5.3)
Sodium: 142 mmol/L (ref 135–146)
Total Bilirubin: 0.4 mg/dL (ref 0.2–1.2)
Total Protein: 6.6 g/dL (ref 6.1–8.1)

## 2019-06-08 LAB — LIPID PANEL
Cholesterol: 121 mg/dL (ref <200)
HDL: 41 mg/dL — ABNORMAL LOW (ref >=50)
LDL Cholesterol (Calc): 63 mg/dL (calc)
Non-HDL Cholesterol (Calc): 80 mg/dL (calc) (ref <130)
Total CHOL/HDL Ratio: 3 (calc) (ref <5.0)
Triglycerides: 88 mg/dL (ref <150)

## 2019-06-08 LAB — HEPATITIS C ANTIBODY
Hepatitis C Ab: NONREACTIVE
SIGNAL TO CUT-OFF: 0.02 (ref <1.00)

## 2019-06-08 LAB — CBC WITH DIFFERENTIAL/PLATELET
Absolute Monocytes: 825 cells/uL (ref 200–950)
Basophils Absolute: 22 cells/uL (ref 0–200)
Basophils Relative: 0.2 %
Eosinophils Absolute: 55 cells/uL (ref 15–500)
Eosinophils Relative: 0.5 %
HCT: 39.5 % (ref 35.0–45.0)
Hemoglobin: 13.4 g/dL (ref 11.7–15.5)
Lymphs Abs: 3333 cells/uL (ref 850–3900)
MCH: 31.7 pg (ref 27.0–33.0)
MCHC: 33.9 g/dL (ref 32.0–36.0)
MCV: 93.4 fL (ref 80.0–100.0)
MPV: 12.1 fL (ref 7.5–12.5)
Monocytes Relative: 7.5 %
Neutro Abs: 6765 cells/uL (ref 1500–7800)
Neutrophils Relative %: 61.5 %
Platelets: 196 10*3/uL (ref 140–400)
RBC: 4.23 10*6/uL (ref 3.80–5.10)
RDW: 12.7 % (ref 11.0–15.0)
Total Lymphocyte: 30.3 %
WBC: 11 10*3/uL — ABNORMAL HIGH (ref 3.8–10.8)

## 2019-06-08 LAB — HEPATITIS B SURFACE ANTIBODY,QUALITATIVE: Hep B S Ab: NONREACTIVE

## 2019-06-08 LAB — HEMOGLOBIN A1C
Hgb A1c MFr Bld: 5.5 % of total Hgb (ref <5.7)
Mean Plasma Glucose: 111 (calc)
eAG (mmol/L): 6.2 (calc)

## 2019-06-09 ENCOUNTER — Other Ambulatory Visit: Payer: Self-pay

## 2019-07-04 ENCOUNTER — Other Ambulatory Visit: Payer: Self-pay | Admitting: Physician Assistant

## 2019-08-01 ENCOUNTER — Other Ambulatory Visit: Payer: Self-pay

## 2019-08-01 DIAGNOSIS — R059 Cough, unspecified: Secondary | ICD-10-CM

## 2019-08-01 DIAGNOSIS — R05 Cough: Secondary | ICD-10-CM

## 2019-08-01 DIAGNOSIS — R0981 Nasal congestion: Secondary | ICD-10-CM

## 2019-08-02 MED ORDER — CETIRIZINE HCL 10 MG PO TABS: 10.0000 mg | ORAL_TABLET | Freq: Every day | ORAL | 11 refills | Status: DC

## 2019-08-08 ENCOUNTER — Encounter: Payer: Self-pay | Admitting: Family Medicine

## 2019-08-08 DIAGNOSIS — R739 Hyperglycemia, unspecified: Secondary | ICD-10-CM | POA: Insufficient documentation

## 2019-08-08 DIAGNOSIS — D72829 Elevated white blood cell count, unspecified: Secondary | ICD-10-CM | POA: Insufficient documentation

## 2019-08-08 DIAGNOSIS — R748 Abnormal levels of other serum enzymes: Secondary | ICD-10-CM | POA: Insufficient documentation

## 2019-09-26 ENCOUNTER — Ambulatory Visit: Payer: Commercial Managed Care - PPO | Admitting: Physician Assistant

## 2019-10-13 ENCOUNTER — Encounter: Payer: Self-pay | Admitting: Physician Assistant

## 2019-10-13 ENCOUNTER — Other Ambulatory Visit: Payer: Self-pay

## 2019-10-13 ENCOUNTER — Ambulatory Visit (INDEPENDENT_AMBULATORY_CARE_PROVIDER_SITE_OTHER): Payer: BC Managed Care – PPO | Admitting: Physician Assistant

## 2019-10-13 DIAGNOSIS — F319 Bipolar disorder, unspecified: Secondary | ICD-10-CM

## 2019-10-13 DIAGNOSIS — F411 Generalized anxiety disorder: Secondary | ICD-10-CM | POA: Diagnosis not present

## 2019-10-13 DIAGNOSIS — G4700 Insomnia, unspecified: Secondary | ICD-10-CM

## 2019-10-13 MED ORDER — DIVALPROEX SODIUM ER 500 MG PO TB24: 1000.0000 mg | ORAL_TABLET | Freq: Every day | ORAL | 1 refills | Status: DC

## 2019-10-13 MED ORDER — QUETIAPINE FUMARATE 25 MG PO TABS: 25.0000 mg | ORAL_TABLET | Freq: Every evening | ORAL | 1 refills | Status: DC | PRN

## 2019-10-13 MED ORDER — PROPRANOLOL HCL 20 MG PO TABS: 20.0000 mg | ORAL_TABLET | Freq: Two times a day (BID) | ORAL | 1 refills | Status: DC

## 2019-10-13 MED ORDER — DIAZEPAM 10 MG PO TABS: 10.0000 mg | ORAL_TABLET | Freq: Three times a day (TID) | ORAL | 2 refills | Status: DC | PRN

## 2019-10-13 MED ORDER — LAMOTRIGINE 100 MG PO TABS: 100.0000 mg | ORAL_TABLET | Freq: Two times a day (BID) | ORAL | 1 refills | Status: DC

## 2019-10-13 NOTE — Progress Notes (Signed)
[facility] Med Check  Patient ID: Alicia Long,  MRN: [id number]  PCP: Steele Sizer, MD  Date of Evaluation: 10/13/2019 Time spent:20 minutes  Chief Complaint:  Chief Complaint    Follow-up      HISTORY/CURRENT STATUS: HPI For routine med check.  Under a lot of stress. New job since February.  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 There have been a lot of problems with all that situation. Her son is having mental health issues. Her 63 month old grandson has been diagnosed with retinoblastoma a few weeks ago. Today was his first surgery and they nicked his artery. "I'm a basket case."  We realize that when going over her medication she is not taking the Lamictal as directed.  She is only been taking 150 mg daily.  Patient denies increased energy with decreased need for sleep, no increased talkativeness, no racing thoughts, no impulsivity or risky behaviors, no increased spending, no increased libido, no grandiosity.  Denies dizziness, syncope, seizures, numbness, tingling, tremor is gone since using the Inderal, tics, unsteady gait, slurred speech, confusion. Denies muscle or joint pain, stiffness, or dystonia.  Individual Medical History/ Review of Systems: Changes? :No    Past medications for mental health diagnoses include: Wellbutrin caused tremor, Cymbalta, Prozac, Valium, Depakote high dose caused worsening of tremor.   Allergies: Chloraprep one step [chlorhexidine gluconate]  Current Medications:  Current Outpatient Medications:  .  atorvastatin (LIPITOR) 40 MG tablet, Take 1 tablet (40 mg total) by mouth daily., Disp: 90 tablet, Rfl: 3 .  diazepam (VALIUM) 10 MG tablet, Take 1 tablet (10 mg total) by mouth every 8 (eight) hours as needed for anxiety., Disp: 90 tablet, Rfl: 2 .  divalproex (DEPAKOTE ER) 500 MG 24 hr tablet, Take 2 tablets (1,000 mg total) by mouth daily. PM, Disp: 180 tablet, Rfl: 1 .  EUCRISA 2 %  OINT, , Disp: , Rfl:  .  lamoTRIgine (LAMICTAL) 100 MG tablet, Take 1 tablet (100 mg total) by mouth 2 (two) times daily., Disp: 180 tablet, Rfl: 1 .  propranolol (INDERAL) 20 MG tablet, Take 1 tablet (20 mg total) by mouth 2 (two) times daily., Disp: 180 tablet, Rfl: 1 .  QUEtiapine (SEROQUEL) 25 MG tablet, Take 1-2 tablets (25-50 mg total) by mouth at bedtime as needed., Disp: 180 tablet, Rfl: 1 .  tildrakizumab-asmn (ILUMYA) 100 MG/ML subcutaneous injection, Inject 100 mg into the skin every 3 (three) months., Disp: , Rfl:  .  cetirizine (ZYRTEC) 10 MG tablet, Take 1 tablet (10 mg total) by mouth daily. (Patient not taking: Reported on 10/13/2019), Disp: 30 tablet, Rfl: 11 .  estradiol (ESTRACE VAGINAL) 0.1 MG/GM vaginal cream, Place 1 Applicatorful vaginally 3 (three) times a week. Except first two weeks use it nightly (Patient not taking: Reported on 10/13/2019), Disp: 42.5 g, Rfl: 12 .  Multiple Vitamin (MULTI-VITAMINS) TABS, Take 1 tablet daily by mouth., Disp: , Rfl:  .  Promethazine-Codeine 6.25-10 MG/5ML SOLN, Take 5 mLs by mouth at bedtime. (Patient not taking: Reported on 06/06/2019), Disp: 118 mL, Rfl: 0 Medication Side Effects: none  Family Medical/ Social History: Changes? Yes  New job, Art therapist for [employer]:  Last menstrual period 08/16/2008.There is no height or weight on file to calculate BMI.  General Appearance: Casual and Well Groomed  Eye Contact:  Good  Speech:  Clear and Coherent  Volume:  Normal  Mood:  Depressed  Affect:  Appropriate and Tearful  Thought Process:  Goal Directed and Descriptions of Associations: Intact  Orientation:  Full (Time, Place, and Person)  Thought Content: Logical   Suicidal Thoughts:  No  Homicidal Thoughts:  No  Memory:  WNL  Judgement:  Good  Insight:  Good  Psychomotor Activity:  Normal  Concentration:  Concentration: Good  Recall:  Good  Fund of Knowledge: Good  Language: Good  Assets:  Desire for  Improvement  ADL's:  Intact  Cognition: WNL  Prognosis:  Good    DIAGNOSES:    ICD-10-CM   1. Generalized anxiety disorder  F41.1   2. Bipolar I disorder ([place])  F31.9   3. Insomnia, unspecified type  G47.00     Receiving Psychotherapy: No    RECOMMENDATIONS:  PDMP reviewed. I spent 30 minutes with her.  Continue Valium 10 mg 1 3 times daily as needed. Continue Depakote ER 500 mg, 2 p.o. nightly. Increase Lamictal 100 mg, to  1 p.o. twice daily. Continue propranolol 20 mg 1 twice daily for tremor. Continue Seroquel 25 mg, 1 nightly as needed sleep. Return in 6 weeks  Donnal Moat, PA-C

## 2019-11-28 ENCOUNTER — Encounter: Payer: Self-pay | Admitting: Physician Assistant

## 2019-11-28 ENCOUNTER — Other Ambulatory Visit: Payer: Self-pay

## 2019-11-28 ENCOUNTER — Ambulatory Visit (INDEPENDENT_AMBULATORY_CARE_PROVIDER_SITE_OTHER): Payer: BC Managed Care – PPO | Admitting: Physician Assistant

## 2019-11-28 VITALS — BP 110/73 | HR 62

## 2019-11-28 DIAGNOSIS — G4700 Insomnia, unspecified: Secondary | ICD-10-CM

## 2019-11-28 DIAGNOSIS — F319 Bipolar disorder, unspecified: Secondary | ICD-10-CM

## 2019-11-28 DIAGNOSIS — Z79899 Other long term (current) drug therapy: Secondary | ICD-10-CM | POA: Diagnosis not present

## 2019-11-28 DIAGNOSIS — R251 Tremor, unspecified: Secondary | ICD-10-CM

## 2019-11-28 DIAGNOSIS — R413 Other amnesia: Secondary | ICD-10-CM

## 2019-11-28 DIAGNOSIS — F411 Generalized anxiety disorder: Secondary | ICD-10-CM | POA: Diagnosis not present

## 2019-11-28 MED ORDER — PROPRANOLOL HCL 40 MG PO TABS: 40.0000 mg | ORAL_TABLET | Freq: Two times a day (BID) | ORAL | 2 refills | Status: DC

## 2019-11-28 NOTE — Progress Notes (Signed)
[facility]  Patient ID: Alicia Long,  MRN: [id number]  PCP: Steele Sizer, MD  Date of Evaluation: 11/28/2019 Time spent:40 minutes  Chief Complaint:  Chief Complaint    Follow-up      HISTORY/CURRENT STATUS: HPI For routine med check.  Is feeling a lot better mentally, after increasing the Lamictal and propranolol.  But tremor of hand is worse.  Right is worse (dominant hand.)  Has a hard time writing or eating.  Also is more forgetful.  She cannot remember things like she used to and she is always having to keep notes around reminding her of tasks that need to be done.  She is able to enjoy things.  Energy and motivation are good.  She is not missing any work.  Appetite is normal.  She is sleeping well.  No suicidal or homicidal thoughts.  She is still stressed about her children.  Her daughter is in jail now for possession of heroin and drug paraphernalia.  She is actually thankful for that because at least she is safe.  Her daughter's boyfriend died a few days ago from an overdose.  Patient denies increased energy with decreased need for sleep, no increased talkativeness, no racing thoughts, no impulsivity or risky behaviors, no increased spending, no increased libido, no grandiosity, no increased irritability or anger, and no hallucinations.  Denies dizziness, syncope, seizures, numbness, tingling, tremor as noted above, tics, unsteady gait, slurred speech, confusion. Denies muscle or joint pain, stiffness, or dystonia.  Individual Medical History/ Review of Systems: Changes? :No    Past medications for mental health diagnoses include: Wellbutrin caused tremor, Cymbalta, Prozac, Valium, Depakote high dose caused worsening of tremor.   Allergies: Chloraprep one step [chlorhexidine gluconate]  Current Medications:  Current Outpatient Medications:  .  atorvastatin (LIPITOR) 40 MG tablet, Take 1 tablet (40 mg total) by mouth daily., Disp: 90 tablet, Rfl: 3 .   diazepam (VALIUM) 10 MG tablet, Take 1 tablet (10 mg total) by mouth every 8 (eight) hours as needed for anxiety., Disp: 90 tablet, Rfl: 2 .  divalproex (DEPAKOTE ER) 500 MG 24 hr tablet, Take 2 tablets (1,000 mg total) by mouth daily. PM, Disp: 180 tablet, Rfl: 1 .  EUCRISA 2 % OINT, , Disp: , Rfl:  .  lamoTRIgine (LAMICTAL) 100 MG tablet, Take 1 tablet (100 mg total) by mouth 2 (two) times daily., Disp: 180 tablet, Rfl: 1 .  Multiple Vitamin (MULTI-VITAMINS) TABS, Take 1 tablet daily by mouth., Disp: , Rfl:  .  QUEtiapine (SEROQUEL) 25 MG tablet, Take 1-2 tablets (25-50 mg total) by mouth at bedtime as needed., Disp: 180 tablet, Rfl: 1 .  tildrakizumab-asmn (ILUMYA) 100 MG/ML subcutaneous injection, Inject 100 mg into the skin every 3 (three) months., Disp: , Rfl:  .  cetirizine (ZYRTEC) 10 MG tablet, Take 1 tablet (10 mg total) by mouth daily. (Patient not taking: Reported on 10/13/2019), Disp: 30 tablet, Rfl: 11 .  estradiol (ESTRACE VAGINAL) 0.1 MG/GM vaginal cream, Place 1 Applicatorful vaginally 3 (three) times a week. Except first two weeks use it nightly (Patient not taking: Reported on 10/13/2019), Disp: 42.5 g, Rfl: 12 .  Promethazine-Codeine 6.25-10 MG/5ML SOLN, Take 5 mLs by mouth at bedtime. (Patient not taking: Reported on 06/06/2019), Disp: 118 mL, Rfl: 0 .  propranolol (INDERAL) 40 MG tablet, Take 1 tablet (40 mg total) by mouth 2 (two) times daily., Disp: 60 tablet, Rfl: 2 Medication Side Effects: none  Family Medical/ Social History: Changes? No  MENTAL HEALTH EXAM:  Blood pressure 110/73, pulse 62, last menstrual period 08/16/2008.There is no height or weight on file to calculate BMI.  General Appearance: Casual and Well Groomed  Eye Contact:  Good  Speech:  Clear and Coherent and Normal Rate  Volume:  Normal  Mood:  Euthymic  Affect:  Appropriate  Thought Process:  Goal Directed and Descriptions of Associations: Intact  Orientation:  Full (Time, Place, and Person)  Thought  Content: Logical   Suicidal Thoughts:  No  Homicidal Thoughts:  No  Memory:  Immediate and recent memory is fair to poor, otherwise normal.  Judgement:  Good  Insight:  Good  Psychomotor Activity:  Tremor and Bilaterally but right is worse, with motion.  Concentration:  Concentration: Good and Attention Span: Good  Recall:  Good  Fund of Knowledge: Good  Language: Good  Assets:  Desire for Improvement  ADL's:  Intact  Cognition: WNL  Prognosis:  Good    DIAGNOSES:    ICD-10-CM   1. Tremor  R25.1   2. Encounter for long-term (current) use of medications  Z79.899 Valproic acid level    Lamotrigine level    Ammonia    Comprehensive metabolic panel  3. Bipolar I disorder ([place])  F31.9   4. Generalized anxiety disorder  F41.1   5. Insomnia, unspecified type  G47.00   6. Memory deficit  R41.3     Receiving Psychotherapy: No    RECOMMENDATIONS:  PDMP reviewed. I provided 40 minutes of face to face time during this encounter. We discussed the memory issues.  I think she is so stressed that is affecting her memory.  She has had several major life events happen in the past few years.  1 is the change in her job, getting custody of one of her grandchildren, and also worry concerning her daughter's drug use.  I explained this is like having multiple tabs open on any computer, it slows down, and our minds are just the same.  When it gets overwhelmed, we can think as well, cannot remember, and we do not react as quickly as we used to.  If this becomes a continuous problem, then we will address it. We discussed increasing the propranolol to help with the tremor. Increase propranolol to 40 mg, 1 p.o. every morning and 20 mg 1 p.o. nightly for 4 days, then increase to 40 mg 1 p.o. twice daily. Continue Valium 10 mg 1 3 times daily as needed. Continue Depakote ER 500 mg, 2 p.o. nightly. Increase Lamictal 100 mg, to  1 p.o. twice daily. Continue Seroquel 25 mg, 1 nightly as needed sleep. Draw  Depakote level, Lamictal level, ammonia, and CMP.  She is going out of town in the next week and will have it drawn after she returns. Return in 3 months.  She will call in 3 to 4 weeks to let me know how she is doing with the propranolol.  Donnal Moat, PA-C

## 2019-11-28 NOTE — Patient Instructions (Signed)
On the Propranolol (Inderal) take 40 mg every morning and 20 mg every night for 4 days, and then increase to 40 mg twice a day.

## 2020-01-14 ENCOUNTER — Other Ambulatory Visit: Payer: Self-pay | Admitting: Physician Assistant

## 2020-02-28 ENCOUNTER — Ambulatory Visit: Payer: BC Managed Care – PPO | Admitting: Physician Assistant

## 2020-03-28 ENCOUNTER — Ambulatory Visit: Payer: BC Managed Care – PPO | Admitting: Physician Assistant

## 2020-05-17 ENCOUNTER — Other Ambulatory Visit: Payer: Self-pay | Admitting: Physician Assistant

## 2020-05-24 ENCOUNTER — Other Ambulatory Visit: Payer: Self-pay | Admitting: Physician Assistant

## 2020-05-26 ENCOUNTER — Other Ambulatory Visit: Payer: Self-pay | Admitting: Family Medicine

## 2020-05-26 DIAGNOSIS — I700 Atherosclerosis of aorta: Secondary | ICD-10-CM

## 2020-05-26 NOTE — Telephone Encounter (Signed)
Requested Prescriptions  Pending Prescriptions Disp Refills  . atorvastatin (LIPITOR) 40 MG tablet [Pharmacy Med Name: ATORVASTATIN 40 MG TABLET] 90 tablet 3    Sig: TAKE 1 TABLET BY MOUTH EVERY DAY     Cardiovascular:  Antilipid - Statins Failed - 05/26/2020  9:31 AM      Failed - HDL in normal range and within 360 days    HDL  Date Value Ref Range Status  06/06/2019 41 (L) > OR = 50 mg/dL Final         Passed - Total Cholesterol in normal range and within 360 days    Cholesterol  Date Value Ref Range Status  06/06/2019 121 <200 mg/dL Final         Passed - LDL in normal range and within 360 days    LDL Cholesterol (Calc)  Date Value Ref Range Status  06/06/2019 63 mg/dL (calc) Final    Comment:    Reference range: <100 . Desirable range <100 mg/dL for primary prevention;   <70 mg/dL for patients with CHD or diabetic patients  with > or = 2 CHD risk factors. Marland Kitchen LDL-C is now calculated using the Martin-Hopkins  calculation, which is a validated novel method providing  better accuracy than the Friedewald equation in the  estimation of LDL-C.  Cresenciano Genre et al. Annamaria Helling. 2683;419(62): 2061-2068  (http://education.[facility].com/faq/FAQ164)          Passed - Triglycerides in normal range and within 360 days    Triglycerides  Date Value Ref Range Status  06/06/2019 88 <150 mg/dL Final         Passed - Patient is not pregnant      Passed - Valid encounter within last 12 months    Recent Outpatient Visits          11 months ago Aortic atherosclerosis [facility])   [facility] Steele Sizer, MD   1 year ago Persistent cough for 3 weeks or longer   [facility] Steele Sizer, MD   1 year ago Persistent cough for 3 weeks or longer   [facility] Steele Sizer, MD   1 year ago Cough   [place], NP   2 years ago Acute non-recurrent pansinusitis   [place], [place]      Future Appointments            In 1 week Steele Sizer, MD [facility], [facility]

## 2020-05-29 ENCOUNTER — Telehealth: Payer: Self-pay | Admitting: Physician Assistant

## 2020-05-29 ENCOUNTER — Encounter: Payer: Self-pay | Admitting: Physician Assistant

## 2020-05-29 ENCOUNTER — Telehealth (INDEPENDENT_AMBULATORY_CARE_PROVIDER_SITE_OTHER): Payer: BC Managed Care – PPO | Admitting: Physician Assistant

## 2020-05-29 DIAGNOSIS — G4700 Insomnia, unspecified: Secondary | ICD-10-CM

## 2020-05-29 DIAGNOSIS — F411 Generalized anxiety disorder: Secondary | ICD-10-CM | POA: Diagnosis not present

## 2020-05-29 DIAGNOSIS — F319 Bipolar disorder, unspecified: Secondary | ICD-10-CM | POA: Diagnosis not present

## 2020-05-29 DIAGNOSIS — R251 Tremor, unspecified: Secondary | ICD-10-CM

## 2020-05-29 MED ORDER — LAMOTRIGINE 100 MG PO TABS: 100.0000 mg | ORAL_TABLET | Freq: Two times a day (BID) | ORAL | 1 refills | Status: DC

## 2020-05-29 MED ORDER — PROPRANOLOL HCL 40 MG PO TABS: 40.0000 mg | ORAL_TABLET | Freq: Two times a day (BID) | ORAL | 1 refills | Status: DC

## 2020-05-29 MED ORDER — DIVALPROEX SODIUM ER 500 MG PO TB24: ORAL_TABLET | ORAL | 1 refills | Status: DC

## 2020-05-29 NOTE — Telephone Encounter (Signed)
  Ms. Alicia Long, Alicia Long are scheduled for a virtual visit with your provider today.    Just as we do with appointments in the office, we must obtain your consent to participate.  Your consent will be active for this visit and any virtual visit you may have with one of our providers in the next 365 days.    If you have a MyChart account, I can also send a copy of this consent to you electronically.  All virtual visits are billed to your insurance company just like a traditional visit in the office.  As this is a virtual visit, video technology does not allow for your provider to perform a traditional examination.  This may limit your provider's ability to fully assess your condition.  If your provider identifies any concerns that need to be evaluated in person or the need to arrange testing such as labs, EKG, etc, we will make arrangements to do so.    Although advances in technology are sophisticated, we cannot ensure that it will always work on either your end or our end.  If the connection with a video visit is poor, we may have to switch to a telephone visit.  With either a video or telephone visit, we are not always able to ensure that we have a secure connection.   I need to obtain your verbal consent now.   Are you willing to proceed with your visit today?   [name] has provided verbal consent on 05/29/2020 for a virtual visit (video or telephone).   Donnal Moat, PA-C 05/29/2020  10:18 AM

## 2020-05-29 NOTE — Progress Notes (Signed)
[facility]  Patient ID: Alicia Long,  MRN: [id number]  PCP: Steele Sizer, MD  Date of Evaluation: 05/29/2020 Time spent:30 minutes  Chief Complaint:  Chief Complaint    Anxiety; Depression     Virtual Visit via Telehealth  I connected with patient by telephone, with their informed consent, and verified patient privacy and that I am speaking with the correct person using two identifiers.  I am private, in my office and the patient is at home.  Due to problems with caregility, I am unable to do a video visit.  I discussed the limitations, risks, security and privacy concerns of performing an evaluation and management service by telephone and the availability of in person appointments. I also discussed with the patient that there may be a patient responsible charge related to this service. The patient expressed understanding and agreed to proceed.   I discussed the assessment and treatment plan with the patient. The patient was provided an opportunity to ask questions and all were answered. The patient agreed with the plan and demonstrated an understanding of the instructions.   The patient was advised to call back or seek an in-person evaluation if the symptoms worsen or if the condition fails to improve as anticipated.  I provided 30 minutes of non-face-to-face time during this encounter.  HISTORY/CURRENT STATUS: HPI For routine med check. 3 month overdue for appt. Pt states she was called by our office and cancelled the 36-month appointment and stated that she was told they didn't know when she could be seen. She can't tell me who she spoke with.  Possibly has COVID so unable to come in to office.  She will be seeing someone this afternoon to be tested for COVID.  She is able to enjoy things.  Energy and motivation are good.  She is not missing any work.  Appetite is normal.  She is sleeping well.  Not isolating.  Does not cry easily.  No suicidal or homicidal  thoughts.  Anxiety is well controlled.  She does not take the Valium often.  It is still helpful when she needs it.  She is not having panic attacks for the most part, it is more of a generalized sense of unease when she does have the anxiety.  The propranolol has greatly helped with the tremor.  If she drinks too much caffeine the tremor recurs.  She has learned to limit that.  Denies dizziness or feeling faint.  Patient denies increased energy with decreased need for sleep, no increased talkativeness, no racing thoughts, no impulsivity or risky behaviors, no increased spending, no increased libido, no grandiosity, no increased irritability or anger, no paranoia, and no hallucinations.  Denies dizziness, syncope, seizures, numbness, tingling, tremor as noted above, tics, unsteady gait, slurred speech, confusion. Denies muscle or joint pain, stiffness, or dystonia.  Individual Medical History/ Review of Systems: Changes? :No    Past medications for mental health diagnoses include: Wellbutrin caused tremor, Cymbalta, Prozac, Valium, Depakote high dose caused worsening of tremor.   Allergies: Chloraprep one step [chlorhexidine gluconate]  Current Medications:  Current Outpatient Medications:  .  atorvastatin (LIPITOR) 40 MG tablet, TAKE 1 TABLET BY MOUTH EVERY DAY, Disp: 90 tablet, Rfl: 3 .  diazepam (VALIUM) 10 MG tablet, TAKE 1 TABLET (10 MG TOTAL) BY MOUTH EVERY 8 (EIGHT) HOURS AS NEEDED FOR ANXIETY., Disp: 90 tablet, Rfl: 0 .  EUCRISA 2 % OINT, , Disp: , Rfl:  .  Multiple Vitamin (MULTI-VITAMINS) TABS, Take 1  tablet daily by mouth., Disp: , Rfl:  .  QUEtiapine (SEROQUEL) 25 MG tablet, Take 1-2 tablets (25-50 mg total) by mouth at bedtime as needed., Disp: 180 tablet, Rfl: 1 .  cetirizine (ZYRTEC) 10 MG tablet, Take 1 tablet (10 mg total) by mouth daily. (Patient not taking: No sig reported), Disp: 30 tablet, Rfl: 11 .  divalproex (DEPAKOTE ER) 500 MG 24 hr tablet, TAKE 2 TABLETS BY MOUTH  DAILY IN THE EVENING, Disp: 180 tablet, Rfl: 1 .  estradiol (ESTRACE VAGINAL) 0.1 MG/GM vaginal cream, Place 1 Applicatorful vaginally 3 (three) times a week. Except first two weeks use it nightly (Patient not taking: No sig reported), Disp: 42.5 g, Rfl: 12 .  lamoTRIgine (LAMICTAL) 100 MG tablet, Take 1 tablet (100 mg total) by mouth 2 (two) times daily., Disp: 180 tablet, Rfl: 1 .  Promethazine-Codeine 6.25-10 MG/5ML SOLN, Take 5 mLs by mouth at bedtime. (Patient not taking: No sig reported), Disp: 118 mL, Rfl: 0 .  propranolol (INDERAL) 40 MG tablet, Take 1 tablet (40 mg total) by mouth 2 (two) times daily., Disp: 180 tablet, Rfl: 1 .  tildrakizumab-asmn (ILUMYA) 100 MG/ML subcutaneous injection, Inject 100 mg into the skin every 3 (three) months. (Patient not taking: Reported on 05/29/2020), Disp: , Rfl:  Medication Side Effects: none  Family Medical/ Social History: Changes? No  MENTAL HEALTH EXAM:  Last menstrual period 08/16/2008.There is no height or weight on file to calculate BMI.  General Appearance: Unable to assess  Eye Contact:  Unable to assess  Speech:  Clear and Coherent and Normal Rate  Volume:  Normal  Mood:  Euthymic  Affect:  Unable to assess  Thought Process:  Goal Directed and Descriptions of Associations: Intact  Orientation:  Full (Time, Place, and Person)  Thought Content: Logical   Suicidal Thoughts:  No  Homicidal Thoughts:  No  Memory:  Unable to assess  Judgement:  Good  Insight:  Good  Psychomotor Activity:  Unable to assess  Concentration:  Concentration: Good and Attention Span: Good  Recall:  Good  Fund of Knowledge: Good  Language: Good  Assets:  Desire for Improvement  ADL's:  Intact  Cognition: WNL  Prognosis:  Good    DIAGNOSES:    ICD-10-CM   1. Bipolar I disorder ([place])  F31.9   2. Tremor  R25.1   3. Generalized anxiety disorder  F41.1   4. Insomnia, unspecified type  G47.00     Receiving Psychotherapy: No    RECOMMENDATIONS:   PDMP reviewed. I provided 30 minutes of nonface to face time during this encounter, in which we discussed her response to the medications.  At the last visit we increased the Lamictal which has been helpful.  Therefore no changes in medications will be made at this time. Labs were ordered at the last visit.  Patient states she had them drawn at [facility].  I will ask our medical records specialist to call about those results.  [place] is always on top of having her labs drawn when they are ordered. Continue Valium 10 mg 1 3 times daily as needed. Continue propranolol 40 mg, 1 p.o. twice daily Continue Depakote ER 500 mg, 2 p.o. nightly. Continue Lamictal 100 mg, 1 p.o. twice daily. Continue Seroquel 25 mg, 1 nightly as needed sleep. Return in 6 months.  Donnal Moat, PA-C

## 2020-06-07 ENCOUNTER — Encounter: Payer: BC Managed Care – PPO | Admitting: Family Medicine

## 2020-06-19 ENCOUNTER — Other Ambulatory Visit: Payer: Self-pay | Admitting: Physician Assistant

## 2020-07-28 ENCOUNTER — Other Ambulatory Visit: Payer: Self-pay | Admitting: Physician Assistant

## 2020-11-26 ENCOUNTER — Ambulatory Visit: Payer: Self-pay | Admitting: Physician Assistant

## 2020-12-09 ENCOUNTER — Other Ambulatory Visit: Payer: Self-pay | Admitting: Physician Assistant

## 2020-12-10 NOTE — Telephone Encounter (Signed)
Please schedule appt

## 2020-12-13 ENCOUNTER — Other Ambulatory Visit: Payer: Self-pay | Admitting: Physician Assistant

## 2020-12-13 NOTE — Telephone Encounter (Signed)
Please schedule appt

## 2020-12-19 NOTE — Telephone Encounter (Signed)
LM to schedule appt. Overdue for appt.

## 2021-01-09 NOTE — Telephone Encounter (Signed)
Pt made an appt for 10/ 18

## 2021-01-09 NOTE — Telephone Encounter (Signed)
Left message for pt to call and schedulr

## 2021-02-26 ENCOUNTER — Ambulatory Visit: Payer: BC Managed Care – PPO | Admitting: Physician Assistant

## 2021-03-07 ENCOUNTER — Other Ambulatory Visit: Payer: Self-pay | Admitting: Physician Assistant

## 2021-03-11 NOTE — Telephone Encounter (Signed)
Left message for pt to schedule

## 2021-03-27 ENCOUNTER — Encounter: Payer: Self-pay | Admitting: Orthopaedic Surgery

## 2021-03-27 ENCOUNTER — Other Ambulatory Visit: Payer: Self-pay

## 2021-03-27 ENCOUNTER — Ambulatory Visit (INDEPENDENT_AMBULATORY_CARE_PROVIDER_SITE_OTHER): Payer: BC Managed Care – PPO | Admitting: Orthopaedic Surgery

## 2021-03-27 DIAGNOSIS — M65312 Trigger thumb, left thumb: Secondary | ICD-10-CM | POA: Insufficient documentation

## 2021-03-27 DIAGNOSIS — R200 Anesthesia of skin: Secondary | ICD-10-CM | POA: Insufficient documentation

## 2021-03-27 MED ORDER — LIDOCAINE HCL 1 % IJ SOLN
0.3300 mL | INTRAMUSCULAR | Status: AC | PRN
  Administered 2021-03-27: .33 mL

## 2021-03-27 MED ORDER — METHYLPREDNISOLONE ACETATE 40 MG/ML IJ SUSP
0.3300 mg | INTRAMUSCULAR | Status: AC | PRN
  Administered 2021-03-27: .33 mg

## 2021-03-27 MED ORDER — BUPIVACAINE HCL 0.25 % IJ SOLN
0.3300 mL | INTRAMUSCULAR | Status: AC | PRN
  Administered 2021-03-27: .33 mL
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
## 2022-08-18 ENCOUNTER — Other Ambulatory Visit: Payer: Self-pay

## 2022-08-26 ENCOUNTER — Other Ambulatory Visit: Payer: Self-pay | Admitting: Internal Medicine

## 2022-08-26 DIAGNOSIS — Z1231 Encounter for screening mammogram for malignant neoplasm of breast: Secondary | ICD-10-CM

## 2022-09-09 ENCOUNTER — Ambulatory Visit: Payer: BC Managed Care – PPO | Admitting: Gastroenterology

## 2022-09-21 ENCOUNTER — Ambulatory Visit: Payer: BC Managed Care – PPO | Admitting: Internal Medicine

## 2022-10-27 ENCOUNTER — Other Ambulatory Visit: Payer: Self-pay | Admitting: Student

## 2022-10-27 ENCOUNTER — Ambulatory Visit
Admission: RE | Admit: 2022-10-27 | Discharge: 2022-10-27 | Disposition: A | Discharge status: Home / Self Care | Payer: Commercial Managed Care - PPO | Source: Ambulatory Visit | Attending: Internal Medicine | Admitting: Internal Medicine

## 2022-10-27 DIAGNOSIS — Z1231 Encounter for screening mammogram for malignant neoplasm of breast: Secondary | ICD-10-CM

## 2022-12-08 ENCOUNTER — Other Ambulatory Visit: Payer: Self-pay

## 2022-12-08 ENCOUNTER — Ambulatory Visit: Payer: BC Managed Care – PPO | Admitting: Gastroenterology
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
